# Patient Record
Sex: Female | Born: 1950
Health system: Southern US, Community
[De-identification: ages and names within clinical notes are randomized; demographics above are authoritative.]

## PROBLEM LIST (undated history)

## (undated) DIAGNOSIS — R112 Nausea with vomiting, unspecified: Secondary | ICD-10-CM

## (undated) DIAGNOSIS — Z9889 Other specified postprocedural states: Secondary | ICD-10-CM

## (undated) DIAGNOSIS — G5603 Carpal tunnel syndrome, bilateral upper limbs: Secondary | ICD-10-CM

## (undated) DIAGNOSIS — C50912 Malignant neoplasm of unspecified site of left female breast: Secondary | ICD-10-CM

## (undated) DIAGNOSIS — M1990 Unspecified osteoarthritis, unspecified site: Secondary | ICD-10-CM

## (undated) HISTORY — DX: Carpal tunnel syndrome, bilateral upper limbs: G56.03

## (undated) HISTORY — PX: COLONOSCOPY: SHX174

## (undated) HISTORY — DX: Unspecified osteoarthritis, unspecified site: M19.90

## (undated) HISTORY — PX: FRACTURE SURGERY: SHX138

## (undated) HISTORY — PX: CARPAL TUNNEL RELEASE: SHX101

---

## 1998-10-23 ENCOUNTER — Ambulatory Visit (HOSPITAL_COMMUNITY): Admission: RE | Admit: 1998-10-23 | Discharge: 1998-10-23 | Payer: Self-pay | Admitting: Gastroenterology

## 1999-03-11 ENCOUNTER — Other Ambulatory Visit: Admission: RE | Admit: 1999-03-11 | Discharge: 1999-03-11 | Payer: Self-pay | Admitting: *Deleted

## 1999-07-10 ENCOUNTER — Encounter: Admission: RE | Admit: 1999-07-10 | Discharge: 1999-07-10 | Payer: Self-pay | Admitting: *Deleted

## 1999-07-10 ENCOUNTER — Encounter: Payer: Self-pay | Admitting: *Deleted

## 1999-07-17 ENCOUNTER — Encounter: Admission: RE | Admit: 1999-07-17 | Discharge: 1999-07-17 | Payer: Self-pay | Admitting: *Deleted

## 1999-07-17 ENCOUNTER — Encounter: Payer: Self-pay | Admitting: *Deleted

## 1999-11-07 ENCOUNTER — Emergency Department (HOSPITAL_COMMUNITY): Admission: EM | Admit: 1999-11-07 | Discharge: 1999-11-07 | Payer: Self-pay

## 2000-03-22 ENCOUNTER — Other Ambulatory Visit: Admission: RE | Admit: 2000-03-22 | Discharge: 2000-03-22 | Payer: Self-pay | Admitting: *Deleted

## 2000-07-18 ENCOUNTER — Encounter: Payer: Self-pay | Admitting: Internal Medicine

## 2000-07-18 ENCOUNTER — Encounter: Admission: RE | Admit: 2000-07-18 | Discharge: 2000-07-18 | Payer: Self-pay | Admitting: Internal Medicine

## 2001-05-18 ENCOUNTER — Other Ambulatory Visit: Admission: RE | Admit: 2001-05-18 | Discharge: 2001-05-18 | Payer: Self-pay | Admitting: *Deleted

## 2001-07-20 ENCOUNTER — Encounter: Admission: RE | Admit: 2001-07-20 | Discharge: 2001-07-20 | Payer: Self-pay | Admitting: Internal Medicine

## 2001-07-20 ENCOUNTER — Encounter: Payer: Self-pay | Admitting: Internal Medicine

## 2002-07-13 ENCOUNTER — Encounter: Admission: RE | Admit: 2002-07-13 | Discharge: 2002-07-13 | Payer: Self-pay | Admitting: Internal Medicine

## 2002-07-13 ENCOUNTER — Encounter: Payer: Self-pay | Admitting: Internal Medicine

## 2002-07-23 ENCOUNTER — Encounter: Payer: Self-pay | Admitting: Internal Medicine

## 2002-07-23 ENCOUNTER — Encounter: Admission: RE | Admit: 2002-07-23 | Discharge: 2002-07-23 | Payer: Self-pay | Admitting: Internal Medicine

## 2002-07-24 ENCOUNTER — Encounter: Payer: Self-pay | Admitting: Internal Medicine

## 2002-07-24 ENCOUNTER — Encounter: Admission: RE | Admit: 2002-07-24 | Discharge: 2002-07-24 | Payer: Self-pay | Admitting: Internal Medicine

## 2002-08-15 ENCOUNTER — Ambulatory Visit (HOSPITAL_COMMUNITY): Admission: RE | Admit: 2002-08-15 | Discharge: 2002-08-15 | Payer: Self-pay | Admitting: Internal Medicine

## 2002-08-15 ENCOUNTER — Encounter: Payer: Self-pay | Admitting: Internal Medicine

## 2003-07-31 ENCOUNTER — Encounter: Admission: RE | Admit: 2003-07-31 | Discharge: 2003-07-31 | Payer: Self-pay | Admitting: Internal Medicine

## 2003-08-14 ENCOUNTER — Ambulatory Visit (HOSPITAL_COMMUNITY): Admission: RE | Admit: 2003-08-14 | Discharge: 2003-08-14 | Payer: Self-pay | Admitting: Internal Medicine

## 2003-08-31 DIAGNOSIS — C50912 Malignant neoplasm of unspecified site of left female breast: Secondary | ICD-10-CM

## 2003-08-31 HISTORY — PX: BREAST BIOPSY: SHX20

## 2003-08-31 HISTORY — DX: Malignant neoplasm of unspecified site of left female breast: C50.912

## 2003-09-12 ENCOUNTER — Encounter: Admission: RE | Admit: 2003-09-12 | Discharge: 2003-09-12 | Payer: Self-pay | Admitting: Internal Medicine

## 2003-09-18 ENCOUNTER — Encounter (INDEPENDENT_AMBULATORY_CARE_PROVIDER_SITE_OTHER): Payer: Self-pay | Admitting: *Deleted

## 2003-09-18 ENCOUNTER — Encounter: Admission: RE | Admit: 2003-09-18 | Discharge: 2003-09-18 | Payer: Self-pay | Admitting: Internal Medicine

## 2003-10-01 ENCOUNTER — Encounter (HOSPITAL_COMMUNITY): Admission: RE | Admit: 2003-10-01 | Discharge: 2003-12-30 | Payer: Self-pay | Admitting: General Surgery

## 2003-10-02 ENCOUNTER — Encounter (INDEPENDENT_AMBULATORY_CARE_PROVIDER_SITE_OTHER): Payer: Self-pay | Admitting: Specialist

## 2003-10-02 ENCOUNTER — Encounter: Payer: Self-pay | Admitting: General Surgery

## 2003-10-02 ENCOUNTER — Encounter: Admission: RE | Admit: 2003-10-02 | Discharge: 2003-10-02 | Payer: Self-pay | Admitting: General Surgery

## 2003-10-14 ENCOUNTER — Inpatient Hospital Stay (HOSPITAL_COMMUNITY): Admission: RE | Admit: 2003-10-14 | Discharge: 2003-10-16 | Payer: Self-pay | Admitting: General Surgery

## 2003-10-14 ENCOUNTER — Encounter (INDEPENDENT_AMBULATORY_CARE_PROVIDER_SITE_OTHER): Payer: Self-pay | Admitting: Specialist

## 2003-10-14 HISTORY — PX: MASTECTOMY WITH AXILLARY LYMPH NODE DISSECTION: SHX5661

## 2003-10-14 HISTORY — PX: RECONSTRUCTION BREAST W/ TRAM FLAP: SUR1079

## 2003-10-14 HISTORY — PX: MASTECTOMY: SHX3

## 2003-10-28 ENCOUNTER — Ambulatory Visit (HOSPITAL_COMMUNITY): Admission: RE | Admit: 2003-10-28 | Discharge: 2003-10-28 | Payer: Self-pay | Admitting: Oncology

## 2003-10-30 ENCOUNTER — Ambulatory Visit (HOSPITAL_COMMUNITY): Admission: RE | Admit: 2003-10-30 | Discharge: 2003-10-30 | Payer: Self-pay | Admitting: Oncology

## 2003-11-12 ENCOUNTER — Ambulatory Visit (HOSPITAL_BASED_OUTPATIENT_CLINIC_OR_DEPARTMENT_OTHER): Admission: RE | Admit: 2003-11-12 | Discharge: 2003-11-12 | Payer: Self-pay | Admitting: General Surgery

## 2003-11-12 ENCOUNTER — Ambulatory Visit: Admission: RE | Admit: 2003-11-12 | Discharge: 2003-12-17 | Payer: Self-pay | Admitting: Radiation Oncology

## 2003-11-20 ENCOUNTER — Ambulatory Visit (HOSPITAL_COMMUNITY): Admission: RE | Admit: 2003-11-20 | Discharge: 2003-11-20 | Payer: Self-pay | Admitting: Oncology

## 2004-02-25 ENCOUNTER — Ambulatory Visit (HOSPITAL_COMMUNITY): Admission: RE | Admit: 2004-02-25 | Discharge: 2004-02-25 | Payer: Self-pay | Admitting: Oncology

## 2004-03-31 ENCOUNTER — Ambulatory Visit (HOSPITAL_BASED_OUTPATIENT_CLINIC_OR_DEPARTMENT_OTHER): Admission: RE | Admit: 2004-03-31 | Discharge: 2004-03-31 | Payer: Self-pay | Admitting: General Surgery

## 2004-06-10 ENCOUNTER — Ambulatory Visit (HOSPITAL_BASED_OUTPATIENT_CLINIC_OR_DEPARTMENT_OTHER): Admission: RE | Admit: 2004-06-10 | Discharge: 2004-06-10 | Payer: Self-pay | Admitting: Plastic Surgery

## 2004-07-04 ENCOUNTER — Ambulatory Visit: Payer: Self-pay | Admitting: Oncology

## 2004-07-07 IMAGING — CR DG CHEST 2V
2 series · 2 of 2 positions shown · non-contrast
Comparison: none

CLINICAL DATA: 52-year-old with left breast cancer.  Preadmission.
 TWO-VIEW CHEST
 Comparison 09/09/95.
 The heart size and mediastinal contours are unremarkable.  The lungs are clear.  The visualized skeleton is unremarkable.
 IMPRESSION
 No active disease.

[view not recorded (1 of 2)]
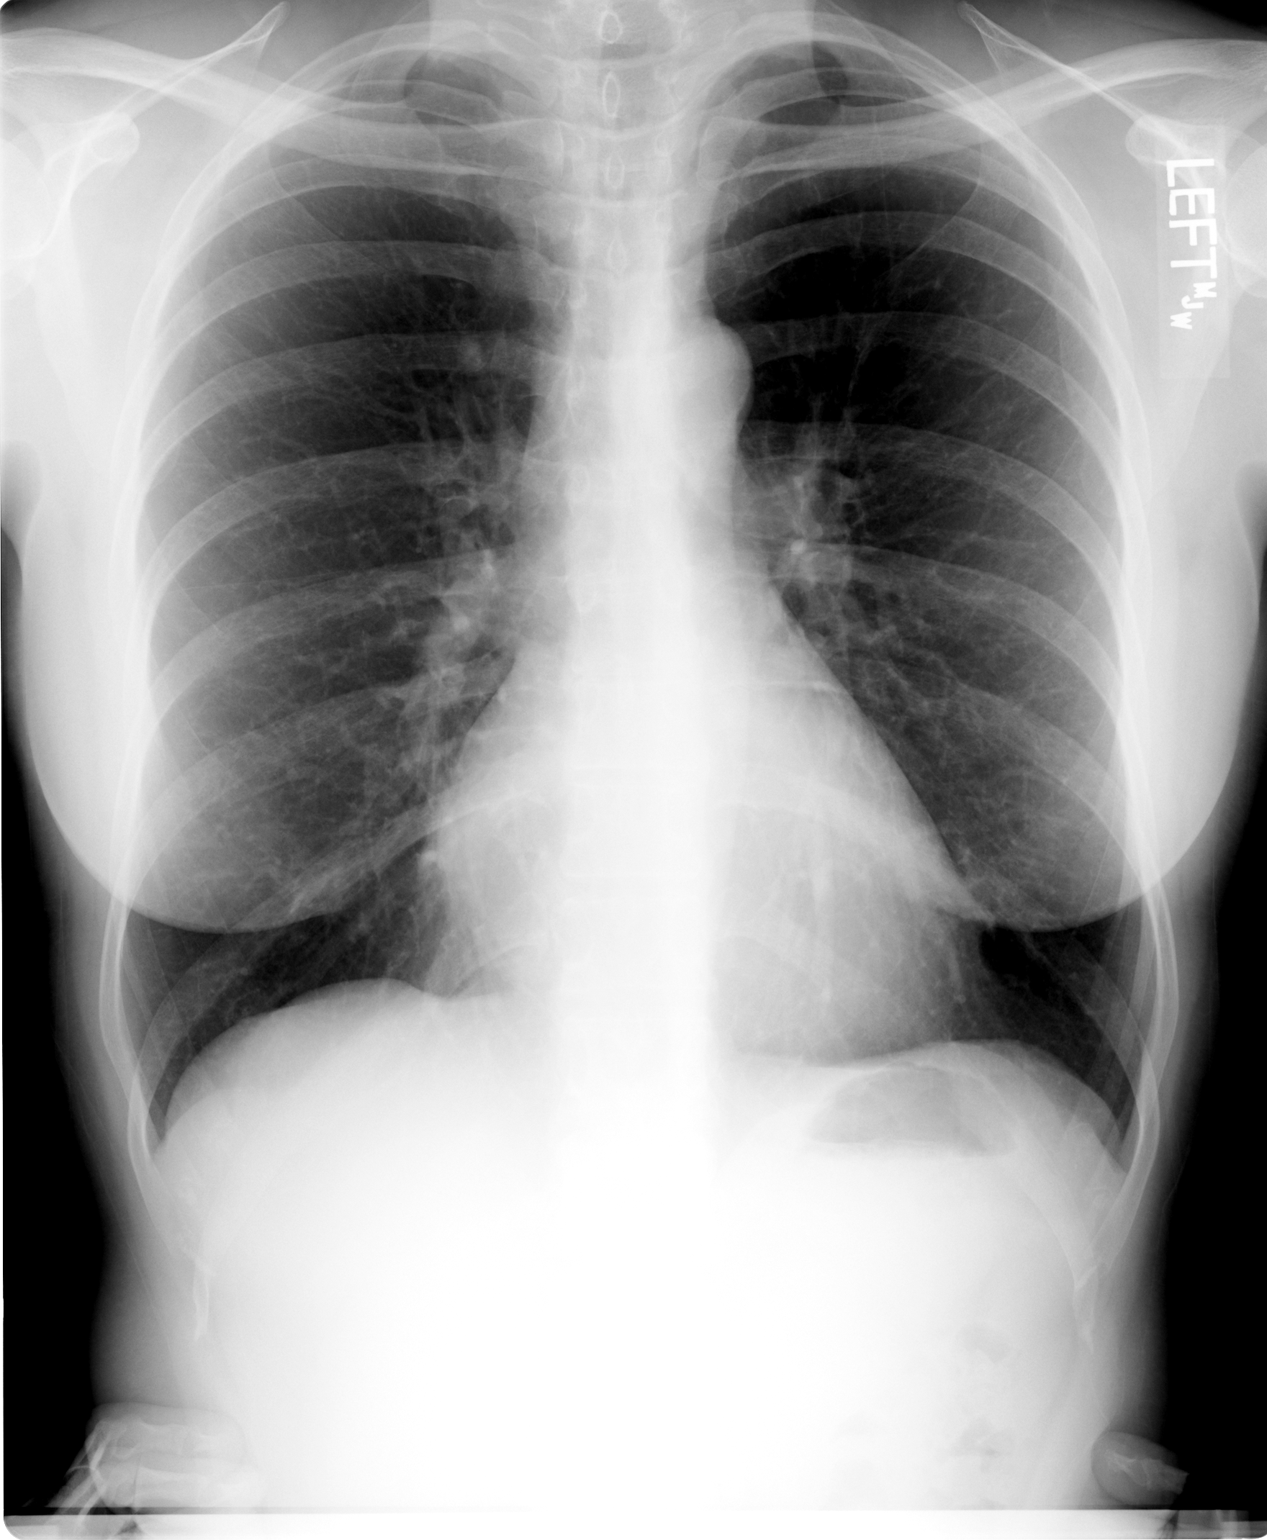

[view not recorded (2 of 2)]
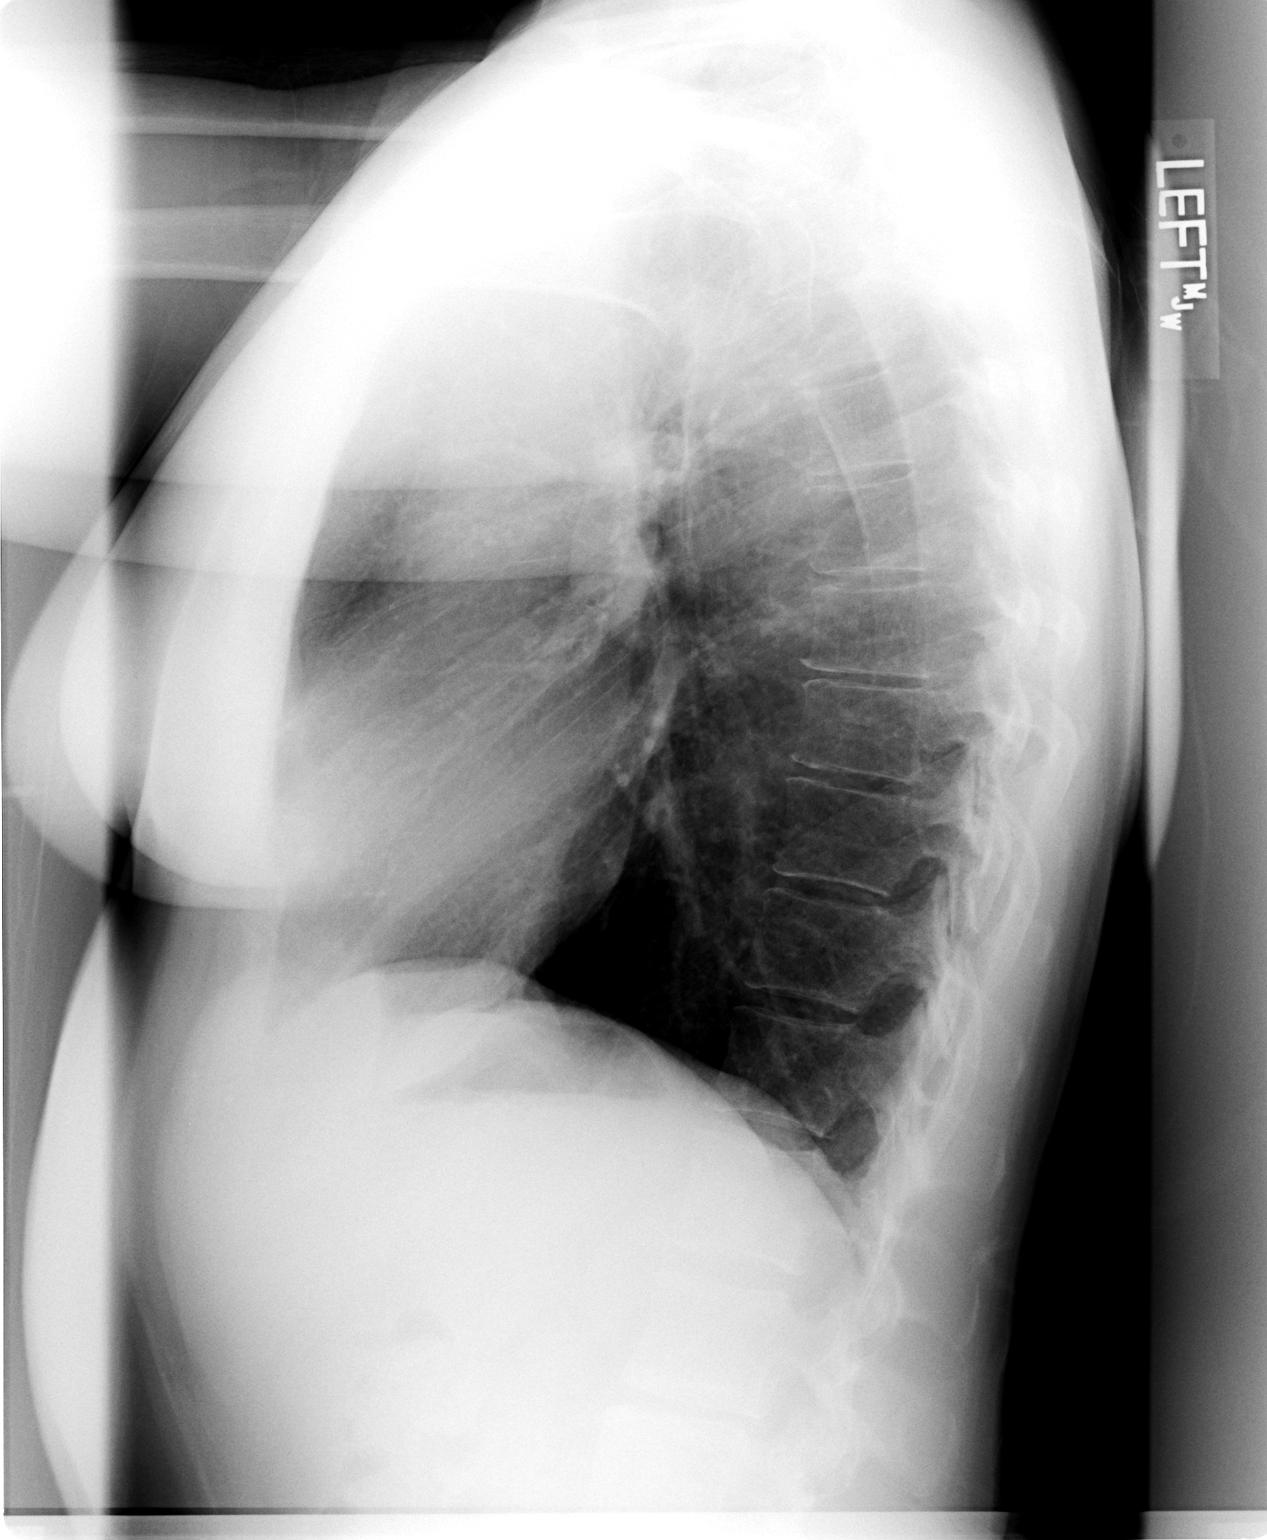

[2 of 2 positions shown; findings below may reference images not displayed]

## 2004-07-25 IMAGING — NM NM CARDIA MUGA REST
6 series · 36 of 36 positions shown · non-contrast
Comparison: none.

CLINICAL DATA: 52 year-old with new diagnosis of breast cancer; pre-chemotherapy baseline evaluation.
 NUCLEAR MEDICINE REST MUGA 10/28/03
TECHNIQUE: 23.3 mCi Ec00m labeled red blood cells were used for resting radionuclide ventriculography.

[Series 1: mu muga · 4.34mm/px · 6 of 16 frames shown (1 of 6)]
[frame 2/16]
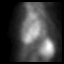
[frame 4/16]
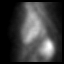
[frame 7/16]
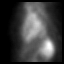
[frame 10/16]
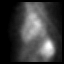
[frame 12/16]
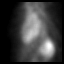
[frame 15/16]
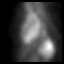

[Series 1: mu muga · 4.34mm/px · 6 of 16 frames shown (2 of 6)]
[frame 2/16]
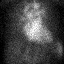
[frame 4/16]
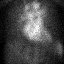
[frame 7/16]
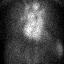
[frame 10/16]
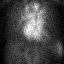
[frame 12/16]
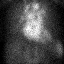
[frame 15/16]
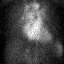

[Series 1: mu muga · 4.34mm/px · 6 of 16 frames shown (3 of 6)]
[frame 2/16]
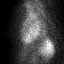
[frame 4/16]
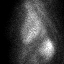
[frame 7/16]
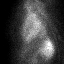
[frame 10/16]
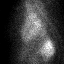
[frame 12/16]
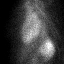
[frame 15/16]
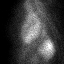

[Series 1: mu muga · 4.34mm/px · 6 of 16 frames shown (4 of 6)]
[frame 2/16]
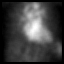
[frame 4/16]
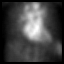
[frame 7/16]
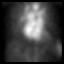
[frame 10/16]
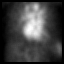
[frame 12/16]
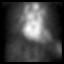
[frame 15/16]
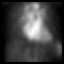

[Series 1: mu muga · 4.34mm/px · 6 of 16 frames shown (5 of 6)]
[frame 2/16]
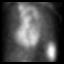
[frame 4/16]
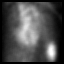
[frame 7/16]
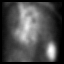
[frame 10/16]
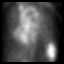
[frame 12/16]
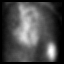
[frame 15/16]
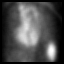

[Series 1: mu muga · 4.34mm/px · 6 of 16 frames shown (6 of 6)]
[frame 2/16]
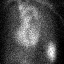
[frame 4/16]
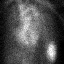
[frame 7/16]
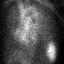
[frame 10/16]
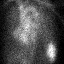
[frame 12/16]
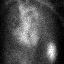
[frame 15/16]
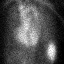

[36 of 36 positions shown; findings below may reference images not displayed]

FINDINGS: The estimated left ventricular ejection fraction was measured at 69% by one technologist and 67% by a second technologist.  Review of the gated images in cine mode on the computer demonstrates normal wall motion throughout.
 IMPRESSION
 Estimated resting left ventricular ejection fraction 68% with normal wall motion.

## 2004-09-15 ENCOUNTER — Encounter: Admission: RE | Admit: 2004-09-15 | Discharge: 2004-09-15 | Payer: Self-pay | Admitting: Oncology

## 2004-09-30 ENCOUNTER — Ambulatory Visit: Payer: Self-pay | Admitting: Oncology

## 2004-10-02 ENCOUNTER — Encounter: Admission: RE | Admit: 2004-10-02 | Discharge: 2004-10-02 | Payer: Self-pay | Admitting: Oncology

## 2004-11-17 ENCOUNTER — Ambulatory Visit (HOSPITAL_COMMUNITY): Admission: RE | Admit: 2004-11-17 | Discharge: 2004-11-17 | Payer: Self-pay | Admitting: Oncology

## 2005-01-13 ENCOUNTER — Ambulatory Visit: Payer: Self-pay | Admitting: Oncology

## 2005-03-30 ENCOUNTER — Ambulatory Visit: Payer: Self-pay | Admitting: Oncology

## 2005-07-20 ENCOUNTER — Ambulatory Visit: Payer: Self-pay | Admitting: Oncology

## 2005-08-04 ENCOUNTER — Encounter: Admission: RE | Admit: 2005-08-04 | Discharge: 2005-08-04 | Payer: Self-pay | Admitting: Oncology

## 2005-10-11 ENCOUNTER — Encounter: Admission: RE | Admit: 2005-10-11 | Discharge: 2005-10-11 | Payer: Self-pay | Admitting: Oncology

## 2005-11-18 ENCOUNTER — Ambulatory Visit: Payer: Self-pay | Admitting: Oncology

## 2006-03-28 ENCOUNTER — Ambulatory Visit: Payer: Self-pay | Admitting: Oncology

## 2006-03-28 LAB — CBC WITH DIFFERENTIAL/PLATELET
Basophils Absolute: 0 10*3/uL (ref 0.0–0.1)
EOS%: 0.6 % (ref 0.0–7.0)
HGB: 13.3 g/dL (ref 11.6–15.9)
MCH: 31.8 pg (ref 26.0–34.0)
MCV: 92.7 fL (ref 81.0–101.0)
MONO%: 6.3 % (ref 0.0–13.0)
RBC: 4.18 10*6/uL (ref 3.70–5.32)
RDW: 12.9 % (ref 11.3–14.5)

## 2006-03-28 LAB — COMPREHENSIVE METABOLIC PANEL
AST: 21 U/L (ref 0–37)
Albumin: 4.6 g/dL (ref 3.5–5.2)
Alkaline Phosphatase: 82 U/L (ref 39–117)
BUN: 20 mg/dL (ref 6–23)
Creatinine, Ser: 0.82 mg/dL (ref 0.40–1.20)
Potassium: 4.1 mEq/L (ref 3.5–5.3)

## 2006-10-13 ENCOUNTER — Encounter: Admission: RE | Admit: 2006-10-13 | Discharge: 2006-10-13 | Payer: Self-pay | Admitting: Oncology

## 2006-10-13 ENCOUNTER — Ambulatory Visit (HOSPITAL_COMMUNITY): Admission: RE | Admit: 2006-10-13 | Discharge: 2006-10-13 | Payer: Self-pay | Admitting: Oncology

## 2006-10-19 ENCOUNTER — Ambulatory Visit: Payer: Self-pay | Admitting: Oncology

## 2006-10-21 LAB — CBC WITH DIFFERENTIAL/PLATELET
BASO%: 0.4 % (ref 0.0–2.0)
Basophils Absolute: 0 10*3/uL (ref 0.0–0.1)
EOS%: 1.1 % (ref 0.0–7.0)
HCT: 39 % (ref 34.8–46.6)
HGB: 13.8 g/dL (ref 11.6–15.9)
LYMPH%: 26.5 % (ref 14.0–48.0)
MCH: 32 pg (ref 26.0–34.0)
MCHC: 35.2 g/dL (ref 32.0–36.0)
MCV: 90.9 fL (ref 81.0–101.0)
MONO%: 5.9 % (ref 0.0–13.0)
NEUT%: 66.1 % (ref 39.6–76.8)
Platelets: 257 10*3/uL (ref 145–400)
lymph#: 1.7 10*3/uL (ref 0.9–3.3)

## 2006-10-21 LAB — COMPREHENSIVE METABOLIC PANEL
ALT: 27 U/L (ref 0–35)
AST: 20 U/L (ref 0–37)
BUN: 13 mg/dL (ref 6–23)
Calcium: 9.3 mg/dL (ref 8.4–10.5)
Chloride: 102 mEq/L (ref 96–112)
Creatinine, Ser: 0.75 mg/dL (ref 0.40–1.20)
Total Bilirubin: 0.7 mg/dL (ref 0.3–1.2)

## 2006-10-21 LAB — CANCER ANTIGEN 27.29: CA 27.29: 14 U/mL (ref 0–39)

## 2007-04-06 ENCOUNTER — Ambulatory Visit: Payer: Self-pay | Admitting: Oncology

## 2007-04-10 LAB — CBC WITH DIFFERENTIAL/PLATELET
Basophils Absolute: 0 10*3/uL (ref 0.0–0.1)
Eosinophils Absolute: 0.1 10*3/uL (ref 0.0–0.5)
HGB: 12.6 g/dL (ref 11.6–15.9)
MONO#: 0.5 10*3/uL (ref 0.1–0.9)
NEUT#: 5.5 10*3/uL (ref 1.5–6.5)
Platelets: 230 10*3/uL (ref 145–400)
RBC: 3.89 10*6/uL (ref 3.70–5.32)
RDW: 12.6 % (ref 11.3–14.5)
WBC: 7.9 10*3/uL (ref 3.9–10.0)

## 2007-04-13 LAB — COMPREHENSIVE METABOLIC PANEL
Albumin: 4.2 g/dL (ref 3.5–5.2)
BUN: 12 mg/dL (ref 6–23)
CO2: 25 mEq/L (ref 19–32)
Calcium: 8.9 mg/dL (ref 8.4–10.5)
Glucose, Bld: 101 mg/dL — ABNORMAL HIGH (ref 70–99)
Potassium: 4.3 mEq/L (ref 3.5–5.3)
Sodium: 140 mEq/L (ref 135–145)
Total Protein: 6.5 g/dL (ref 6.0–8.3)

## 2007-04-13 LAB — VITAMIN D PNL(25-HYDRXY+1,25-DIHY)-BLD: Vit D, 25-Hydroxy: 36 ng/mL (ref 20–57)

## 2007-04-13 LAB — LACTATE DEHYDROGENASE: LDH: 187 U/L (ref 94–250)

## 2007-08-07 ENCOUNTER — Encounter: Admission: RE | Admit: 2007-08-07 | Discharge: 2007-08-07 | Payer: Self-pay | Admitting: Oncology

## 2007-10-11 ENCOUNTER — Ambulatory Visit: Payer: Self-pay | Admitting: Oncology

## 2007-10-13 LAB — CBC WITH DIFFERENTIAL/PLATELET
Basophils Absolute: 0 10*3/uL (ref 0.0–0.1)
EOS%: 0.6 % (ref 0.0–7.0)
Eosinophils Absolute: 0 10*3/uL (ref 0.0–0.5)
HGB: 13.4 g/dL (ref 11.6–15.9)
MCH: 29.9 pg (ref 26.0–34.0)
NEUT#: 5.6 10*3/uL (ref 1.5–6.5)
RBC: 4.5 10*6/uL (ref 3.70–5.32)
RDW: 12.8 % (ref 11.3–14.5)
lymph#: 1.6 10*3/uL (ref 0.9–3.3)

## 2007-10-13 LAB — COMPREHENSIVE METABOLIC PANEL
AST: 23 U/L (ref 0–37)
Albumin: 4.8 g/dL (ref 3.5–5.2)
BUN: 14 mg/dL (ref 6–23)
Calcium: 9.5 mg/dL (ref 8.4–10.5)
Chloride: 104 mEq/L (ref 96–112)
Potassium: 3.6 mEq/L (ref 3.5–5.3)
Sodium: 142 mEq/L (ref 135–145)
Total Protein: 7.5 g/dL (ref 6.0–8.3)

## 2007-10-16 ENCOUNTER — Encounter: Admission: RE | Admit: 2007-10-16 | Discharge: 2007-10-16 | Payer: Self-pay | Admitting: Oncology

## 2008-04-09 ENCOUNTER — Ambulatory Visit: Payer: Self-pay | Admitting: Oncology

## 2008-04-11 LAB — CBC WITH DIFFERENTIAL/PLATELET
BASO%: 0.3 % (ref 0.0–2.0)
Eosinophils Absolute: 0.1 10*3/uL (ref 0.0–0.5)
HCT: 40.8 % (ref 34.8–46.6)
HGB: 14.1 g/dL (ref 11.6–15.9)
MCHC: 34.4 g/dL (ref 32.0–36.0)
MCV: 92.2 fL (ref 81.0–101.0)
NEUT#: 5.2 10*3/uL (ref 1.5–6.5)
Platelets: 254 10*3/uL (ref 145–400)
RBC: 4.43 10*6/uL (ref 3.70–5.32)
RDW: 12.8 % (ref 11.3–14.5)
lymph#: 2 10*3/uL (ref 0.9–3.3)

## 2008-04-12 LAB — COMPREHENSIVE METABOLIC PANEL
Albumin: 4.7 g/dL (ref 3.5–5.2)
BUN: 13 mg/dL (ref 6–23)
Calcium: 9.6 mg/dL (ref 8.4–10.5)
Chloride: 102 mEq/L (ref 96–112)
Glucose, Bld: 106 mg/dL — ABNORMAL HIGH (ref 70–99)
Potassium: 3.9 mEq/L (ref 3.5–5.3)
Total Protein: 7.8 g/dL (ref 6.0–8.3)

## 2008-10-14 ENCOUNTER — Ambulatory Visit: Payer: Self-pay | Admitting: Oncology

## 2008-10-16 ENCOUNTER — Encounter: Admission: RE | Admit: 2008-10-16 | Discharge: 2008-10-16 | Payer: Self-pay | Admitting: Oncology

## 2008-10-16 LAB — CBC WITH DIFFERENTIAL/PLATELET
BASO%: 0.2 % (ref 0.0–2.0)
Basophils Absolute: 0 10*3/uL (ref 0.0–0.1)
LYMPH%: 19.4 % (ref 14.0–48.0)
MCH: 31.8 pg (ref 26.0–34.0)
MONO#: 0.4 10*3/uL (ref 0.1–0.9)
NEUT#: 5.4 10*3/uL (ref 1.5–6.5)
NEUT%: 73.8 % (ref 39.6–76.8)
Platelets: 245 10*3/uL (ref 145–400)
RDW: 12.7 % (ref 11.3–14.5)
WBC: 7.3 10*3/uL (ref 3.9–10.0)
lymph#: 1.4 10*3/uL (ref 0.9–3.3)

## 2008-10-16 LAB — COMPREHENSIVE METABOLIC PANEL
ALT: 36 U/L — ABNORMAL HIGH (ref 0–35)
BUN: 18 mg/dL (ref 6–23)
Chloride: 99 mEq/L (ref 96–112)
Glucose, Bld: 97 mg/dL (ref 70–99)
Potassium: 3.7 mEq/L (ref 3.5–5.3)
Total Bilirubin: 0.6 mg/dL (ref 0.3–1.2)
Total Protein: 7.3 g/dL (ref 6.0–8.3)

## 2009-04-15 ENCOUNTER — Ambulatory Visit: Payer: Self-pay | Admitting: Oncology

## 2009-04-17 LAB — CBC WITH DIFFERENTIAL/PLATELET
EOS%: 0.8 % (ref 0.0–7.0)
HCT: 38.5 % (ref 34.8–46.6)
HGB: 13.2 g/dL (ref 11.6–15.9)
MCH: 31.7 pg (ref 25.1–34.0)
MCHC: 34.3 g/dL (ref 31.5–36.0)
MCV: 92.3 fL (ref 79.5–101.0)
Platelets: 244 10*3/uL (ref 145–400)
RBC: 4.17 10*6/uL (ref 3.70–5.45)
RDW: 13 % (ref 11.2–14.5)

## 2009-04-17 LAB — COMPREHENSIVE METABOLIC PANEL
Albumin: 4.3 g/dL (ref 3.5–5.2)
BUN: 14 mg/dL (ref 6–23)
Chloride: 105 mEq/L (ref 96–112)
Glucose, Bld: 139 mg/dL — ABNORMAL HIGH (ref 70–99)
Potassium: 3.5 mEq/L (ref 3.5–5.3)
Sodium: 139 mEq/L (ref 135–145)
Total Protein: 7.2 g/dL (ref 6.0–8.3)

## 2009-04-17 LAB — LACTATE DEHYDROGENASE: LDH: 166 U/L (ref 94–250)

## 2009-04-18 LAB — VITAMIN D 25 HYDROXY (VIT D DEFICIENCY, FRACTURES): Vit D, 25-Hydroxy: 48 ng/mL (ref 30–89)

## 2009-10-21 ENCOUNTER — Encounter: Admission: RE | Admit: 2009-10-21 | Discharge: 2009-10-21 | Payer: Self-pay | Admitting: Oncology

## 2010-04-21 ENCOUNTER — Ambulatory Visit: Payer: Self-pay | Admitting: Oncology

## 2010-04-23 LAB — COMPREHENSIVE METABOLIC PANEL
ALT: 22 U/L (ref 0–35)
AST: 20 U/L (ref 0–37)
Albumin: 4.6 g/dL (ref 3.5–5.2)
BUN: 20 mg/dL (ref 6–23)
CO2: 25 mEq/L (ref 19–32)
Calcium: 9.7 mg/dL (ref 8.4–10.5)
Potassium: 3.9 mEq/L (ref 3.5–5.3)
Total Protein: 6.7 g/dL (ref 6.0–8.3)

## 2010-04-23 LAB — CBC WITH DIFFERENTIAL/PLATELET
BASO%: 0.4 % (ref 0.0–2.0)
Basophils Absolute: 0 10*3/uL (ref 0.0–0.1)
EOS%: 0.7 % (ref 0.0–7.0)
HGB: 13.6 g/dL (ref 11.6–15.9)
LYMPH%: 25 % (ref 14.0–49.7)
MCH: 31.5 pg (ref 25.1–34.0)
MCV: 92.8 fL (ref 79.5–101.0)
NEUT%: 66.5 % (ref 38.4–76.8)
RBC: 4.32 10*6/uL (ref 3.70–5.45)
RDW: 13 % (ref 11.2–14.5)

## 2010-04-23 LAB — VITAMIN D 25 HYDROXY (VIT D DEFICIENCY, FRACTURES): Vit D, 25-Hydroxy: 65 ng/mL (ref 30–89)

## 2010-09-19 ENCOUNTER — Encounter: Payer: Self-pay | Admitting: General Surgery

## 2010-09-19 ENCOUNTER — Other Ambulatory Visit: Payer: Self-pay | Admitting: Oncology

## 2010-09-19 DIAGNOSIS — Z9012 Acquired absence of left breast and nipple: Secondary | ICD-10-CM

## 2010-10-27 ENCOUNTER — Ambulatory Visit
Admission: RE | Admit: 2010-10-27 | Discharge: 2010-10-27 | Disposition: A | Discharge status: Home / Self Care | Payer: 59 | Source: Ambulatory Visit | Attending: Oncology | Admitting: Oncology

## 2010-10-27 DIAGNOSIS — Z9012 Acquired absence of left breast and nipple: Secondary | ICD-10-CM

## 2011-01-15 NOTE — Op Note (Signed)
[name], [name]                           ACCOUNT NO.:  [id number]   MEDICAL RECORD NO.:  [id number]                   PATIENT TYPE:  AMB   LOCATION:  DSC                                  FACILITY:  [facility]   PHYSICIAN:  Rose Phi. Maple Hudson, M.D.                DATE OF BIRTH:  [birth year]   DATE OF PROCEDURE:  03/31/2004  DATE OF DISCHARGE:                                 OPERATIVE REPORT   REFERRING PHYSICIAN:  Rolan Bucco L. Kearney Hard, M.D.   PREOPERATIVE DIAGNOSIS:  Carcinoma of the breast.   POSTOPERATIVE DIAGNOSIS:  Carcinoma of the breast.   OPERATION:  Removal of Port-A-Cath.   SURGEON:  Rose Phi. Maple Hudson, M.D.   ANESTHESIA:  Local.   DESCRIPTION OF PROCEDURE:  The patient placed on the operating table and the  right upper chest prepped and draped in the usual fashion.  Under local  anesthesia, the old incision was opened up and the port exposed.  I grasped  the catheter and removed it.  There was no bleeding.  We divided the two  Prolene sutures, holding it in place and removed the port.  Again, there was  no bleeding.  Subcuticular closure of 4-0 Monocryl and Steri-Strips carried  out.  Dressing applied.  The patient then allowed to go home.                                               Rose Phi. Maple Hudson, M.D.    PRY/MEDQ  D:  03/31/2004  T:  03/31/2004  Job:  [id number]   cc:   Rolan Bucco L. Dover, M.D.  [address]  [place]  [place] [address]  Fax: [phone]

## 2011-01-15 NOTE — Op Note (Signed)
[name], [name]                           ACCOUNT NO.:  [id number]   MEDICAL RECORD NO.:  [id number]                   PATIENT TYPE:  AMB   LOCATION:  DSC                                  FACILITY:  [facility]   PHYSICIAN:  Rose Phi. Maple Hudson, M.D.                DATE OF BIRTH:  Mar 27, 1951   DATE OF PROCEDURE:  11/12/2003  DATE OF DISCHARGE:                                 OPERATIVE REPORT   PREOPERATIVE DIAGNOSIS:  Stage II carcinoma of the left breast.   POSTOPERATIVE DIAGNOSIS:  Stage II carcinoma of the left breast.   OPERATION PERFORMED:  Insertion of Port-A-Cath.   SURGEON:  Rose Phi. Maple Hudson, M.D.   ANESTHESIA:  MAC.   DESCRIPTION OF PROCEDURE:  The patient was placed on the operating table  with a roll between the shoulders and arms down by the side.  The right  upper chest and the neck were prepped and draped in the usual fashion.  Right subclavian puncture was carried out without difficulty and a guidewire  inserted and proper positioning of the guidewire confirmed by fluoroscopy.  We then made an incision on the anterior chest wall and developed a pocket  for the implantable port.  I tunneled between the subclavian puncture site  and the newly developed pocket and passed the catheter through this and then  connected it to the X-Port which was flushed and full of heparinized saline.  We anchored the port in the new pocket with two 2-0 Prolene sutures.  The  catheter tip was then cut to go right at about the fourth interspace.  We  then passed the dilator and peel-away sheath over the wire and removed the  wire followed by the dilator and then passed the catheter through the peel-  away sheath and then removed it.  Fluoroscopy then showed that the catheter  had actually gone in the subclavian vein and then into the left side.  I  then exposed the catheter insertion going under the clavicle and withdrew it  until it was in the proper position, then slid it forward and went down  in  the superior vena cava.  The incisions were then closed with 3-0 Vicryl,  subcuticular 4-0 Monocryl and Steri-Strips.  The system was flushed with  heparinized and the fully heparinized.  It was not left accessed.  Dressings  were applied.  The patient was then transferred to the recovery room in  satisfactory condition having tolerated the procedure well.                                               Rose Phi. Maple Hudson, M.D.    PRY/MEDQ  D:  11/12/2003  T:  11/13/2003  Job:  [id number]

## 2011-01-15 NOTE — Discharge Summary (Signed)
[name], [name]                           ACCOUNT NO.:  [id number]   MEDICAL RECORD NO.:  [id number]                   PATIENT TYPE:  INP   LOCATION:  5710                                 FACILITY:  [facility]   PHYSICIAN:  Alfredia Ferguson, M.D.               DATE OF BIRTH:  [birth year]   DATE OF ADMISSION:  10/14/2003  DATE OF DISCHARGE:  10/16/2003                                 DISCHARGE SUMMARY   ADMISSION DIAGNOSIS:  Left breast carcinoma.   DISCHARGE DIAGNOSIS:  Left breast carcinoma.   OPERATION PERFORMED:  1. Left total mastectomy with axillary lymph node dissection (two lymph     nodes positive).  2. Immediate breast reconstruction with left transverse rectus abdominis     myocutaneous flap.  3. Immediate nipple reconstruction with tripartite flap.  4. Placement of continuous flow pain pump in abdomen.   CHIEF COMPLAINT:  I have breast cancer.   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 She has been advised that  because of the multicentric nature of the disease she will need to undergo  mastectomy. She has agreed to proceed with that surgery and wishes to  undergo immediate breast reconstruction with a tram flap. The patient is  admitted to the hospital for mastectomy and breast reconstruction.   PAST MEDICAL HISTORY:  Significant for occasional GI problems with diarrhea.  The patient also complains of occasional skipped beat in her heart.   PAST SURGICAL HISTORY:  Negative.   MEDICATIONS:  Mobic 7.5 mg a day.   ALLERGIES:  None.   PHYSICAL EXAMINATION:  Please see admission H&P for complete physical exam.   ADMISSION LABORATORY DATA:  Admission laboratory values included a  hemoglobin of 13.9, hematocrit 40. Electrolytes were normal. Urinalysis was  negative. Chest x-ray was normal. Echocardiogram was negative for any  disease.   HOSPITAL COURSE:  On the day of admission,  the patient was taken to the OR  where she underwent left mastectomy. Sentinel node biopsy was suspicious,  and axillary node dissection was carried out. The final pathology returned  two lymph nodes with metastatic carcinoma. One of the lymph nodes was the  sentinel node and one was in the axillary dissection which had a total of  eight lymph nodes removed. The patient also underwent immediate breast  reconstruction with ipsilateral transverse rectus abdominis myocutaneous  flap. She had nipple reconstruction performed immediately and placement of a  continuous infusion (On-Q) pain pump. Postoperative course has been  completely uneventful for the patient. She was started on a diet on the  night of surgery and advanced to regular the following day. She was able to  ambulate on the first postoperative day. She kept her PAS stockings on  throughout her hospitalization while in bed. Dressings were removed on the  second postoperative day.  The TRAM appears to be soft and in good condition.  The nipple appears to be slightly dusky in color but has islands of pink and  I believe will be okay. Abdomen looks fine. Umbilicus is viable. Drainage is  significant enough that I am going to leave the Bolton drains in place at  discharge.   DISCHARGE MEDICATIONS:  Vicodin and Keflex.   FOLLOW UP:  Followup will be provided in five days in my office.   DISCHARGE INSTRUCTIONS:  Discharge instructions have been provided with the  patient from my office. The patient states she understands the discharge  instructions. The pain pump was removed just prior to discharge.                                                Alfredia Ferguson, M.D.    WBB/MEDQ  D:  10/16/2003  T:  10/16/2003  Job:  [id number]

## 2011-01-15 NOTE — Op Note (Signed)
NAMELORIANNE, [name]                 ACCOUNT NO.:  [id number]   MEDICAL RECORD NO.:  [id number]          PATIENT TYPE:  AMB   LOCATION:  DSC                          FACILITY:  [facility]   PHYSICIAN:  Alfredia Ferguson, M.D.  DATE OF BIRTH:  Aug 08, 1951   DATE OF PROCEDURE:  06/10/2004  DATE OF DISCHARGE:                                 OPERATIVE REPORT   PREOPERATIVE DIAGNOSIS:  1.  History of breast cancer.  2.  Acquired absence of left breast.  3.  Acquired asymmetry of bilateral breasts due to breast reconstruction      with transverse rectus abdominis myocutaneous flap on left.  4.  Acquired absence of left nipple.   POSTOPERATIVE DIAGNOSIS:  1.  History of breast cancer.  2.  Acquired absence of left breast.  3.  Acquired asymmetry of bilateral breasts due to breast reconstruction      with transverse rectus abdominis myocutaneous flap on left.  4.  Acquired absence of left nipple.   OPERATION PERFORMED:  1.  Revision, left reconstructed breast using suction assisted lipectomy of      lateral breast and lateral axillary region.  2.  Left nipple reconstruction with double apposing tab technique.   SURGEON:  Alfredia Ferguson, M.D.   ANESTHESIA:  General laryngeal mask.   INDICATIONS FOR PROCEDURE:  This is a 60 year old woman who status post  immediate TRAM flap reconstruction following mastectomy.  She has slightly  larger TRAM flap than the opposite native breast.  Most of the fullness is  in the lateral area.  The patient wishes to undergo revision of the breast  using suction assisted lipectomy as a technique to reduce the lateral aspect  of the breast.  The patient has also had a previous attempt at immediate  nipple reconstruction at the time of her TRAM.  The projection of the nipple  was completely lost.  I am going to attempt to redo the nipple  reconstruction with double apposing tab technique.  The patient understands  the risks of this surgery including inability to  restore symmetry, bleeding,  infection, hematoma, seroma, loss of sensitivity to the skin in the axillary  region.  Necrosis of the reconstructed nipple, loss of projection of the  nipple and overall dissatisfaction with reconstruction.  In spite of that  the patient wishes to proceed with the surgery.   DESCRIPTION OF PROCEDURE:  With the patient in a standing position and prior  to being taken to the operating room, outline for the excess tissue was  marked.  The patient was then taken to the operating room where she was  given general laryngeal mask anesthesia.  Chest was prepped with Betadine  and draped with sterile drapes.  Local anesthesia using a tumescent solution  (1 L of Ringers lactate plus 1 ampule epinephrine 1:1000 plus 30 mL 1%  Xylocaine plain) was infiltrated in the area of liposuction.  Approximately  300 mL of this solution was infiltrated in the lateral aspect of the breast  and axillary region.  While the tumescent solution was setting up and  delivering the vasoconstriction, attention was directed to the nipple  reconstruction.  A lazy-S skin mark was placed using the transverse line of  the mastectomy incision as the long limb of the S.  This S was horizontally  oriented.  At the end of each limb of the short portion of the S, a small  skin mark was placed outlining a pennant of skin.  This S was now incised  down to the subcutaneous tissue.  The square mark at the end of each limb of  the S was now incised and a split thickness skin was elevated up until  reaching the point of the end of the S.  At this point the deeper incision  was extended into the subcutaneous tissues such that each limb of the S had  a small pennant of skin hanging from the end.  The two tabs were now  elevated to the midportion of the marked circle.  The two tabs of tissue  were then fixed to one another in a criss-cross fashion as two hands in  prayer.  They were fixed to each other with  multiple 4-0 chromic sutures.  The tabs of skin were wrapped around the base of this cylinder and fixed in  position with similar suture creating a completely closed cylinder.  The  donor site was now closed by approximating the dermis with multiple  interrupted PDS.  The skin edges were united with a running 4-0 chromic  suture.  The vascularity of the nipple appeared to be good.  One flap was  very pink with good capillary refill.  The lateral flap was somewhat  vasoconstricted but I felt this might be secondary to the tumescent solution  which was in the vicinity.  Liposuction was now carried out in the lateral  reconstructed breast and lateral axillary region using a 3 mm cannula and  the power assisted liposuction technique.  Approximately 200 g of fat was  removed.  There was almost no bleeding.  I felt that I had thinned it to  match the opposite side.  Liposuction was terminated.  Each of the two  access incisions which I had placed were closed with a single 5-0 nylon  suture.  A bulky protective dressing was placed around the nipple.  Foam  pads were placed in the axilla in the area of liposuction.  A  circumferential wrap of 6 inch Ace bandage was placed.  The patient  tolerated the procedure well with almost no bleeding.  The chest was  cleansed, dried and the patient was awakened and extubated and transported  to the recovery room in satisfactory condition.       WBB/MEDQ  D:  06/10/2004  T:  06/10/2004  Job:  [id number]

## 2011-01-15 NOTE — Op Note (Signed)
[name], [name]                           ACCOUNT NO.:  [id number]   MEDICAL RECORD NO.:  [id number]                   PATIENT TYPE:  INP   LOCATION:  2550                                 FACILITY:  [facility]   PHYSICIAN:  Alfredia Ferguson, M.D.               DATE OF BIRTH:  May 18, 1951   DATE OF PROCEDURE:  10/14/2003  DATE OF DISCHARGE:                                 OPERATIVE REPORT   PREOPERATIVE DIAGNOSIS:  1. Breast cancer.  2. Acquired absence of left breast secondary to #1.   POSTOPERATIVE DIAGNOSIS:  1. Breast cancer.  2. Acquired absence of left breast secondary to #1.   PROCEDURE:  1. Left transverse rectus abdominis myocutaneous flap.  2. Creation of nipple using tripartite flap.  3. Placement of pain pump for postoperative pain relief.   SURGEON:  Alfredia Ferguson, M.D.   ASSISTANT:  Jacky Kindle, RNFA   INDICATIONS FOR PROCEDURE:  This is a 60 year old woman with left breast  carcinoma. She has opted to undergo a mastectomy and wishes to undergo a  TRAM flap reconstruction. The plan is to perform an ultra skin-sparing  mastectomy and create a nipple at the time of the breast reconstruction.  The patient understands the risks of this surgery including asymmetry of the  breasts, vascular compromise of the breast creating either complete loss of  the flap or fat necrosis.  The possibility of bleeding, infection, seroma,  hematoma, unsitely scar, abdominal hernia problems, vascular compromise to  the umbilicus, unsitely scarring in the abdomen, and overall dissatisfaction  of the results were discussed with the patient at length. The patient wishes  to proceed with the surgery.   DESCRIPTION OF PROCEDURE:  On the morning of surgery, skin marks were placed  outlining the dimensions of the TRAM flap.  The plan is to do an ipsilateral  TRAM.  The patient was taken to the operating room where abdomen and chest  were prepped and draped in the usual sterile fashion.  My  portion of the  surgery commenced first while waiting for Dr. Maple Hudson to come to the operating  room.  A circular incision was made around the umbilicus and the umbilical  stalk was dissected away from the surrounding abdominal tissue.  The upper  skin mark was incised and the abdominal flap was elevated to the costal  margins on either side and the xiphoid in the midline.  The operating table  back was elevated to ensure that closure could be accomplished at the  anticipated lower skin paddle skin mark and the closure could be with no  significant tension.  Once I was certain of this, the lower skin incision  was made and deepened through the skin and subcutaneous tissue.  The right  side of the skin paddle was elevated off the abdominal wall and the flap was  lifted off 1 cm to the left of  linea alba. The left corner of the skin  paddle was elevated until visualizing the lateral row of the rectus  perforators which was approximately 3 cm medial to the lateral rectus  border.  About this time, Dr. Maple Hudson came to the operating room.  Moist laps  were placed in the wound and care was turned back over to Dr. Maple Hudson, who  completed the mastectomy and an axillary node dissection.  Upon completion  of this, I returned to the operating room to continue my portion of the  procedure.  Two parallel incisions were made in the anterior rectus fascia  beginning at the costal margin on the left side.  These two incisions were  approximately 2 cm apart.  The incisions were through the anterior rectus  fascia until visualizing the rectus muscle.  The medial incision continued  inferiorly skirting along the medial connection of the skin paddle to the  medial anterior rectus fascia.  The lateral fascial incision continued  inferiorly skirting along the lateral connection of the skin paddle to the  anterior rectus fascia.  These two incisions met at the inferior connection  of the skin paddle.  The anterior  rectus fascia was dissected off of the  rectus muscle with care at each tendinous inscription to preserve the  delicate vasculature at this point.  The deep portion of the rectus muscle  was dissected out of its anatomic bed using a combination of electrocautery  and bipolar dissection.  The deep inferior epigastric vessels were  visualized and isolated.  The artery was hemoclipped and the vessel was  divided between multiple hemoclips.  The two veins were hemoclipped in a  similar fashion and divided between multiple hemoclips.  The inferior  portion of the rectus muscle just below the arcuate line was divided using  electrocautery.  The muscle along with the attached skin paddle was now  completely freed except for its connection at the costal margins.  The  superior epigastric vessels were visualized and preserved.  I removed zone 4  of the skin paddle.  The mastectomy defect was inspected.  Hemostasis was  accomplished using electrocautery.  It was copiously irrigated with warm  saline irrigation.  A 10 mm Blake drain was placed in the lateral axillary  gutter and brought out through a separate stab incision.  The inferior  medial corner of the mastectomy dissection, I dissected inferiorly until  connecting with my abdominal dissection.  The rectus flap was now tunneled  and brought up through the mastectomy defect and temporarily stapled in  position.  The abdominal wound was copiously irrigated with warm saline  irrigation and hemostasis was assured using electrocautery.  The anterior  rectus fascia was closed using multiple interrupted buried figure-of-eight 0  Prolene sutures.  Onlay mesh using Marlex mesh was placed over my suture  closure and fixed in position using a running 2-0 Prolene suture along the  periphery of the mesh.  An opening in the mesh was made and the umbilicus  was brought out through this opening.  The pain pump catheters were placed in the desired position and  brought out through a needle puncture holes and  flushed with 0.25% Marcaine.  They were fixed in position using Steri-Strips  until later a permanent connection to the pump at the conclusion of the  procedure.  The abdominal wound was now closed.  This was begun by first  elevating the back of the patient to 30 degrees and flexing the  knees.  The  midline of the incision was approximated using interrupted 2-0 Vicryl  suture.  The incision was temporarily stapled with no tension.  A new  opening for the umbilicus was marked and incised and the umbilicus was  brought through this new opening and fixed in its new position using  multiple interrupted 3-0 Vicryl sutures.  Prior to complete closure, a 10 mm  Blake drain was placed in the lower abdominal wound in a transversely  oriented direction and brought out through a separate stab incision.  The  abdominal wound closure was completed by approximating the dermis using  multiple interrupted 2-0 Vicryl sutures combined with interrupted 3-0  Monocryl sutures.  A running 3-0 Monocryl subcuticular was placed in the  skin edges.  This completed the abdominal closure. The abdominal skin  appeared to be very healthy with excellent capillary refill.  Attention was  directed back to the mastectomy site.  Because the only skin defect taken by  Dr. Maple Hudson was the nipple areolar complex, my plan was to reduce the skin  paddle to the desired size, fit it into the mastectomy defect, and the mark  the location of the nipple areolar complex on the skin paddle. This was  performed without difficulty.  The skin paddle was reduced by removing some  of zone 3 and some of zone 4.  There was excellent bleeding at the cut edge.  The skin paddle was placed in the desired position and the opening at the  skin defect on the natural breast flaps was transposed using a skin marker  onto the skin paddle of the TRAM.  The skin paddle was now brought back out  from the  mastectomy defect.  Within the confines of this circle, a  tripartite flap was designed.  The three points of the flap were pointing to  the 12 o'clock, 3 o'clock, and 9 o'clock positions.  A 2 cm wide, inferiorly  based gap was not marked so that we would not incise it and this would be  the random skin paddle to keep the nipple alive.  This tripartite flap was  incised and was elevated based on the 2 cm inferiorly based skin pedicle.  The flap had approximately 6 to 7 mm of fat on it. The tips of the three  flaps were removed creating blunt ends.  The two flaps that were elevated at  the 9 o'clock and 3 o'clock position were rotated toward another and the  blunt ends were fixed to each other using 4-0 chromic suture.  The flap  which was at the 12 o'clock position was closed down on top of the created  cylinder using a similar suture.  This created the nipple.  The donor site  was closed using interrupted 3-0 Vicryl sutures for the dermis.  The base of the flap was fixed to the cut edges of the donor site flaps.  Vascularity of  the flap appeared to be excellent.  A 42 mm diameter circle was now drawn  around the nipple and this circle was incised creating a 42 mm diameter neo-  areola.  All of the skin and the nipple was left intact and the remainder of  the skin paddle was deepithelialized.  There was excellent bleeding at the  cut edge of the deepithelialized dermis.  The entire flap was returned to  the mastectomy defect and the superior edge of the flap was sewn to the  superior cut dissection of the  mastectomy site.  3-0 Vicryl sutures were  used to suspend the flap from the superior limits of the dissection.  The  mastectomy incision was now closed by approximating the two skin incisions  extending away from the 3 o'clock and 9 o'clock positions from the circular  defect where the nipple areolar complex used to be.  These cut edges were  approximated using interrupted 3-0 Monocryl  suture.  This left a perfectly  circular defect for the neo-nipple areolar complex.  This was fixed in  position using a running 3-0 Monocryl subcuticular.  This completed the  reconstruction.  Symmetry appeared to be good. There was excellent bleeding  of the TRAM prior to completely closing the skin incisions.  The nipple  areolar complex appeared to be viable.  Light dressings were applied over  the mastectomy incisions.  Light dressings were applied over the abdominal  incisions.  The pain pump catheters were connected to the pain pump.  The  site where the catheters exited the skin were covered with Tegaderm. The  patient's estimated blood loss was approximately 200 mL.  The patient was  awakened, extubated, and transported to the recovery room in satisfactory  condition.                                               Alfredia Ferguson, M.D.    WBB/MEDQ  D:  10/14/2003  T:  10/14/2003  Job:  [id number]

## 2011-01-15 NOTE — Op Note (Signed)
NAMEJAYANA, [name]                           ACCOUNT NO.:  [id number]   MEDICAL RECORD NO.:  [id number]                   PATIENT TYPE:  INP   LOCATION:  2899                                 FACILITY:  [facility]   PHYSICIAN:  Rose Phi. Maple Hudson, M.D.                DATE OF BIRTH:  08-27-1951   DATE OF PROCEDURE:  10/14/2003  DATE OF DISCHARGE:                                 OPERATIVE REPORT   PREOPERATIVE DIAGNOSIS:  Probable stage I carcinoma of the left breast,  multicentric.   POSTOPERATIVE DIAGNOSIS:  Probable stage I carcinoma of the left breast,  multicentric.   OPERATION:  1. Blue dye injection.  2. Left sentinel lymph node biopsy.  3. Left total mastectomy.  4. Left axillary node dissection.   SURGEON:  Dr. Francina Ames   ASSISTANT:  Dr. Cyndia Bent   ANESTHESIA:  General.   OPERATIVE PROCEDURE:  After suitable general anesthesia was induced, the  patient was placed in the supine position with the arms extended on the arm  board.  Prior to coming to the operating room, 1 mCi of technetium sulfur  colloid had been injected intradermally.  The early part of the case was  started as part of the tram reconstruction by Dr. Delia Chimes.  When I came  into the case, I injected 5 mL of a mixture of 2 mL of methylene blue and 3  mL of saline in the subareolar tissue and then massaged the breast for about  3 minutes.  We did a skin-sparing incision with a circular incision around  the nipple areolar complex and then a limb extended medially and the limb  extended laterally to give better exposure for the surgery.  Having made the  incisions, I then dissected the upper flap to near the clavicle, then  medially to the sternum, and inferiorly to the inframammary fold at the  rectus fascia and laterally to the latissimus dorsi muscle.   Prior to that, we had made a short transverse axillary incision with  dissection through the subcutaneous tissue to the clavipectoral fascia.   Two  sentinel nodes were identified deep to the clavipectoral fascia.  They were  both blue and hot, and these were removed and submitted to the pathologist  for evaluation.   Having made the flaps, we then began to dissect the breast from medial to  lateral.  At the time we reached the lateral margin of the pectoralis major  muscle, the pathologist reported that there were a number of atypical cells  in one of the two sentinel lymph nodes, and she was concerned as to whether  they would be metastatic and for that reason, I elected to do a completion  axillary node dissection.   As we dissected along the pectoralis major muscle, we retracted it and  exposed the pectoralis minor and incised along the margin of the pectoralis  minor and retracted it as we divided the clavipectoral fascia over the  axillary vein.  We then swept all the tissue that was behind the minor  muscle and inferior to the vein with the long thoracic and thoracodorsal  nerves being identified and preserved, nodal vessels clipped and divided.  Following the removal of the breast and the axillary content, we thoroughly  irrigated the field with saline.  Hemostasis obtained with the cautery.   Dr. Benna Dunks then returned to the case to complete the tram reconstruction  which will be reported in a separate note dictated by Rose Phi. Maple Hudson, M.D.                                               Rose Phi. Maple Hudson, M.D.    PRY/MEDQ  D:  10/14/2003  T:  10/14/2003  Job:  [id number]

## 2011-04-27 ENCOUNTER — Other Ambulatory Visit: Payer: Self-pay | Admitting: Oncology

## 2011-04-27 ENCOUNTER — Encounter (HOSPITAL_BASED_OUTPATIENT_CLINIC_OR_DEPARTMENT_OTHER): Payer: 59 | Admitting: Oncology

## 2011-04-27 DIAGNOSIS — Z170 Estrogen receptor positive status [ER+]: Secondary | ICD-10-CM

## 2011-04-27 DIAGNOSIS — C50419 Malignant neoplasm of upper-outer quadrant of unspecified female breast: Secondary | ICD-10-CM

## 2011-04-27 LAB — CBC WITH DIFFERENTIAL/PLATELET
BASO%: 0.5 % (ref 0.0–2.0)
Basophils Absolute: 0.1 10*3/uL (ref 0.0–0.1)
EOS%: 1 % (ref 0.0–7.0)
HCT: 41.4 % (ref 34.8–46.6)
HGB: 14.2 g/dL (ref 11.6–15.9)
LYMPH%: 27.6 % (ref 14.0–49.7)
MCH: 31.7 pg (ref 25.1–34.0)
MCHC: 34.3 g/dL (ref 31.5–36.0)
MCV: 92.5 fL (ref 79.5–101.0)
MONO%: 9.6 % (ref 0.0–14.0)
NEUT%: 61.3 % (ref 38.4–76.8)
Platelets: 267 10*3/uL (ref 145–400)
lymph#: 3 10*3/uL (ref 0.9–3.3)

## 2011-04-27 LAB — COMPREHENSIVE METABOLIC PANEL
ALT: 17 U/L (ref 0–35)
AST: 16 U/L (ref 0–37)
Alkaline Phosphatase: 79 U/L (ref 39–117)
BUN: 15 mg/dL (ref 6–23)
Calcium: 9.8 mg/dL (ref 8.4–10.5)
Chloride: 101 mEq/L (ref 96–112)
Creatinine, Ser: 0.69 mg/dL (ref 0.50–1.10)
Total Bilirubin: 0.2 mg/dL — ABNORMAL LOW (ref 0.3–1.2)

## 2011-04-27 LAB — VITAMIN D 25 HYDROXY (VIT D DEFICIENCY, FRACTURES): Vit D, 25-Hydroxy: 65 ng/mL (ref 30–89)

## 2011-05-04 ENCOUNTER — Other Ambulatory Visit: Payer: Self-pay | Admitting: Oncology

## 2011-05-04 ENCOUNTER — Encounter: Payer: 59 | Admitting: Oncology

## 2011-05-04 DIAGNOSIS — Z1231 Encounter for screening mammogram for malignant neoplasm of breast: Secondary | ICD-10-CM

## 2011-08-18 ENCOUNTER — Telehealth: Payer: Self-pay | Admitting: *Deleted

## 2011-08-18 NOTE — Telephone Encounter (Signed)
left message to inform the patient of the new date and time in 04-2012 

## 2011-11-01 ENCOUNTER — Ambulatory Visit
Admission: RE | Admit: 2011-11-01 | Discharge: 2011-11-01 | Disposition: A | Discharge status: Home / Self Care | Payer: 59 | Source: Ambulatory Visit | Attending: Oncology | Admitting: Oncology

## 2011-11-01 DIAGNOSIS — Z1231 Encounter for screening mammogram for malignant neoplasm of breast: Secondary | ICD-10-CM

## 2012-04-14 ENCOUNTER — Telehealth: Payer: Self-pay | Admitting: *Deleted

## 2012-04-14 NOTE — Telephone Encounter (Signed)
md out of the office on 05-18-2012 moved patient appointment to 05-22-2012 

## 2012-05-11 ENCOUNTER — Other Ambulatory Visit: Payer: 59 | Admitting: Lab

## 2012-05-15 ENCOUNTER — Other Ambulatory Visit (HOSPITAL_BASED_OUTPATIENT_CLINIC_OR_DEPARTMENT_OTHER): Payer: 59 | Admitting: Lab

## 2012-05-15 DIAGNOSIS — C50419 Malignant neoplasm of upper-outer quadrant of unspecified female breast: Secondary | ICD-10-CM

## 2012-05-15 LAB — CBC WITH DIFFERENTIAL/PLATELET
Basophils Absolute: 0 10*3/uL (ref 0.0–0.1)
EOS%: 1 % (ref 0.0–7.0)
HCT: 36.4 % (ref 34.8–46.6)
HGB: 12.4 g/dL (ref 11.6–15.9)
MCH: 31.4 pg (ref 25.1–34.0)
MCV: 92.2 fL (ref 79.5–101.0)
NEUT%: 74.4 % (ref 38.4–76.8)
lymph#: 1.3 10*3/uL (ref 0.9–3.3)

## 2012-05-15 LAB — COMPREHENSIVE METABOLIC PANEL (CC13)
AST: 22 U/L (ref 5–34)
BUN: 16 mg/dL (ref 7.0–26.0)
Calcium: 9.8 mg/dL (ref 8.4–10.4)
Chloride: 104 mEq/L (ref 98–107)
Creatinine: 0.8 mg/dL (ref 0.6–1.1)

## 2012-05-16 LAB — VITAMIN D 25 HYDROXY (VIT D DEFICIENCY, FRACTURES): Vit D, 25-Hydroxy: 62 ng/mL (ref 30–89)

## 2012-05-18 ENCOUNTER — Ambulatory Visit: Payer: 59 | Admitting: Oncology

## 2012-05-22 ENCOUNTER — Telehealth: Payer: Self-pay | Admitting: *Deleted

## 2012-05-22 ENCOUNTER — Ambulatory Visit (HOSPITAL_BASED_OUTPATIENT_CLINIC_OR_DEPARTMENT_OTHER): Payer: 59 | Admitting: Oncology

## 2012-05-22 VITALS — BP 130/70 | HR 67 | Temp 97.7°F | Resp 20 | Ht 67.2 in | Wt 180.2 lb

## 2012-05-22 DIAGNOSIS — C50319 Malignant neoplasm of lower-inner quadrant of unspecified female breast: Secondary | ICD-10-CM

## 2012-05-22 DIAGNOSIS — C50919 Malignant neoplasm of unspecified site of unspecified female breast: Secondary | ICD-10-CM

## 2012-05-22 DIAGNOSIS — Z1231 Encounter for screening mammogram for malignant neoplasm of breast: Secondary | ICD-10-CM

## 2012-05-22 DIAGNOSIS — Z170 Estrogen receptor positive status [ER+]: Secondary | ICD-10-CM

## 2012-05-22 NOTE — Progress Notes (Signed)
Hematology and Oncology Follow Up Visit  [name] [id number] November 16, 1950 61 y.o. 05/22/2012 11:26 PM   DIAGNOSIS:   Encounter Diagnosis  Name Primary?  . Malignant neoplasm of breast (female), unspecified site Yes     PAST THERAPY:  61 year old woman with history of ER-positive PR-negative breast cancer status post left modified radical mastectomy, TRAM flap reconstruction and enrollment on B. 30 protocol for which she received a.c. Followed by Taxol Gemzar and radiation completed July 2005. Also randomized on MA 27 study status post 5 years of anastrozole, she continues and is currently. Interim History:  Patient is doing well. She has no complaints. She is a enjoying herself and travels to [place] over January of this year. She is followed by her primary care Dr. Who has ordered her bone density test. She is due for followup mammogram which she will schedule herself.  Medications: I have reviewed the patient's current medications.  Allergies: No Known Allergies  Past Medical History, Surgical history, Social history, and Family History were reviewed and updated.  Review of Systems: Constitutional:  Negative for fever, chills, night sweats, anorexia, weight loss, pain. Cardiovascular: negative Respiratory: negative Neurological: negative Dermatological: negative ENT: negative Skin Gastrointestinal: negative Genito-Urinary: negative Hematological and Lymphatic: negative Breast: negative Musculoskeletal: negative Remaining ROS negative.  Physical Exam:  Blood pressure 130/70, pulse 67, temperature 97.7 F (36.5 C), resp. rate 20, height 5' 7.2" (1.707 m), weight 180 lb 3.2 oz (81.738 kg).  ECOG: 0  HEENT:  Sclerae anicteric, conjunctivae pink.  Oropharynx clear.  No mucositis or candidiasis.  Nodes:  No cervical, supraclavicular, or axillary lymphadenopathy palpated.  Breast Exam:  Right breast is benign.  No masses, discharge, skin change, or nipple inversion.  Left  breast isstatus post mastectomy with TRAM flap reconstruction, no evidence of recurrence.  Lungs:  Clear to auscultation bilaterally.  No crackles, rhonchi, or wheezes.  Heart:  Regular rate and rhythm.  Abdomen:  Soft, nontender.  Positive bowel sounds.  No organomegaly or masses palpated.  Musculoskeletal:  No focal spinal tenderness to palpation.  Extremities:  Benign.  No peripheral edema or cyanosis.  Skin:  Benign.  Neuro:  Nonfocal.    Lab Results: Lab Results  Component Value Date   WBC 7.0 05/15/2012   HGB 12.4 05/15/2012   HCT 36.4 05/15/2012   MCV 92.2 05/15/2012   PLT 218 05/15/2012     Chemistry      Component Value Date/Time   NA 139 05/15/2012 1022   NA 140 04/27/2011 1505   K 3.6 05/15/2012 1022   K 3.4* 04/27/2011 1505   CL 104 05/15/2012 1022   CL 101 04/27/2011 1505   CO2 24 05/15/2012 1022   CO2 28 04/27/2011 1505   BUN 16.0 05/15/2012 1022   BUN 15 04/27/2011 1505   CREATININE 0.8 05/15/2012 1022   CREATININE 0.69 04/27/2011 1505      Component Value Date/Time   CALCIUM 9.8 05/15/2012 1022   CALCIUM 9.8 04/27/2011 1505   ALKPHOS 69 05/15/2012 1022   ALKPHOS 79 04/27/2011 1505   AST 22 05/15/2012 1022   AST 16 04/27/2011 1505   ALT 21 05/15/2012 1022   ALT 17 04/27/2011 1505   BILITOT 0.60 05/15/2012 1022   BILITOT 0.2* 04/27/2011 1505       Radiological Studies:  No results found.   IMPRESSIONS AND PLAN: A 61 y.o. female with   History of ER positive breast cancer continues on Arimidex. She few appears to be free  of clinical recurrence. I will see her in a years time. I discussed enrollment in one of our new studies using dermatherapy sheets  Spent more than half the time coordinating care, as well as discussion of BMI and its implications.      [name] 9/23/201311:26 PM Cell [phone]

## 2012-05-22 NOTE — Telephone Encounter (Signed)
Gave patient appointment for 11-20-2012 at the breast center for mammogram  05-16-2013 lab only   05-24-2013 md appointment  Patient aware of all appointments

## 2012-11-18 ENCOUNTER — Telehealth: Payer: Self-pay | Admitting: Oncology

## 2012-11-18 ENCOUNTER — Encounter: Payer: Self-pay | Admitting: Oncology

## 2012-11-18 NOTE — Telephone Encounter (Signed)
Former [facility] pt reassigned to [employer] [facility] [employer]. S/w pt spouse re appt for 9/25 w/JH. Also confirmed 9/17 appt. Letter mailed.

## 2012-11-20 ENCOUNTER — Ambulatory Visit
Admission: RE | Admit: 2012-11-20 | Discharge: 2012-11-20 | Disposition: A | Discharge status: Home / Self Care | Payer: 59 | Source: Ambulatory Visit | Attending: Oncology | Admitting: Oncology

## 2012-11-20 DIAGNOSIS — Z1231 Encounter for screening mammogram for malignant neoplasm of breast: Secondary | ICD-10-CM

## 2013-05-15 ENCOUNTER — Other Ambulatory Visit: Payer: Self-pay | Admitting: Physician Assistant

## 2013-05-15 DIAGNOSIS — C50912 Malignant neoplasm of unspecified site of left female breast: Secondary | ICD-10-CM

## 2013-05-16 ENCOUNTER — Other Ambulatory Visit (HOSPITAL_BASED_OUTPATIENT_CLINIC_OR_DEPARTMENT_OTHER): Payer: 59 | Admitting: Lab

## 2013-05-16 DIAGNOSIS — C50919 Malignant neoplasm of unspecified site of unspecified female breast: Secondary | ICD-10-CM

## 2013-05-16 DIAGNOSIS — C50912 Malignant neoplasm of unspecified site of left female breast: Secondary | ICD-10-CM

## 2013-05-16 LAB — CBC WITH DIFFERENTIAL/PLATELET
BASO%: 0.9 % (ref 0.0–2.0)
EOS%: 0.6 % (ref 0.0–7.0)
LYMPH%: 20.9 % (ref 14.0–49.7)
MCH: 31.4 pg (ref 25.1–34.0)
MCHC: 34.3 g/dL (ref 31.5–36.0)
MCV: 91.8 fL (ref 79.5–101.0)
MONO#: 0.4 10*3/uL (ref 0.1–0.9)
MONO%: 6.6 % (ref 0.0–14.0)
Platelets: 259 10*3/uL (ref 145–400)
RBC: 4.16 10*6/uL (ref 3.70–5.45)
WBC: 6.6 10*3/uL (ref 3.9–10.3)

## 2013-05-16 LAB — COMPREHENSIVE METABOLIC PANEL (CC13)
ALT: 19 U/L (ref 0–55)
AST: 20 U/L (ref 5–34)
Albumin: 3.9 g/dL (ref 3.5–5.0)
Alkaline Phosphatase: 75 U/L (ref 40–150)
BUN: 17.1 mg/dL (ref 7.0–26.0)
CO2: 29 meq/L (ref 22–29)
Calcium: 9.3 mg/dL (ref 8.4–10.4)
Chloride: 104 meq/L (ref 98–109)
Creatinine: 0.8 mg/dL (ref 0.6–1.1)
Glucose: 114 mg/dL (ref 70–140)
Potassium: 3.9 meq/L (ref 3.5–5.1)
Sodium: 141 meq/L (ref 136–145)
Total Bilirubin: 0.38 mg/dL (ref 0.20–1.20)
Total Protein: 7.3 g/dL (ref 6.4–8.3)

## 2013-05-24 ENCOUNTER — Telehealth: Payer: Self-pay | Admitting: *Deleted

## 2013-05-24 ENCOUNTER — Encounter: Payer: Self-pay | Admitting: Family

## 2013-05-24 ENCOUNTER — Ambulatory Visit: Payer: 59 | Admitting: Oncology

## 2013-05-24 ENCOUNTER — Ambulatory Visit (HOSPITAL_BASED_OUTPATIENT_CLINIC_OR_DEPARTMENT_OTHER): Payer: 59 | Admitting: Family

## 2013-05-24 VITALS — BP 161/74 | HR 65 | Temp 98.3°F | Resp 18 | Ht 67.2 in | Wt 182.8 lb

## 2013-05-24 DIAGNOSIS — Z853 Personal history of malignant neoplasm of breast: Secondary | ICD-10-CM | POA: Insufficient documentation

## 2013-05-24 NOTE — Telephone Encounter (Signed)
Wrong chart- no entry.

## 2013-05-24 NOTE — Patient Instructions (Addendum)
Please contact us at [phone] if you have any questions or concerns.  Please continue to do well and enjoy life!!!  Get plenty of rest, drink plenty of water, exercise daily, eat a balanced diet.  Continue to take calcium and vitamin D3 daily.  Complete monthly self-breast examinations.  Have a clinical breast exam by a physician every year.  Have your mammogram completed every year.  For personal comfort: Vitamin E tablets insert nightly or as needed Coconut oil apply daily or as needed Replens Astroglide  OTC Peridin-C - hot flashes/night sweats   Results for orders placed in visit on 05/16/13 (from the past 336 hour(s))  CBC WITH DIFFERENTIAL   Collection Time    05/16/13 10:11 AM      Result Value Range   WBC 6.6  3.9 - 10.3 10e3/uL   NEUT# 4.7  1.5 - 6.5 10e3/uL   HGB 13.1  11.6 - 15.9 g/dL   HCT 16.1  09.6 - 04.5 %   Platelets 259  145 - 400 10e3/uL   MCV 91.8  79.5 - 101.0 fL   MCH 31.4  25.1 - 34.0 pg   MCHC 34.3  31.5 - 36.0 g/dL   RBC 4.09  8.11 - 9.14 10e6/uL   RDW 12.8  11.2 - 14.5 %   lymph# 1.4  0.9 - 3.3 10e3/uL   MONO# 0.4  0.1 - 0.9 10e3/uL   Eosinophils Absolute 0.0  0.0 - 0.5 10e3/uL   Basophils Absolute 0.1  0.0 - 0.1 10e3/uL   NEUT% 71.0  38.4 - 76.8 %   LYMPH% 20.9  14.0 - 49.7 %   MONO% 6.6  0.0 - 14.0 %   EOS% 0.6  0.0 - 7.0 %   BASO% 0.9  0.0 - 2.0 %  COMPREHENSIVE METABOLIC PANEL (CC13)   Collection Time    05/16/13 10:11 AM      Result Value Range   Sodium 141  136 - 145 mEq/L   Potassium 3.9  3.5 - 5.1 mEq/L   Chloride 104  98 - 109 mEq/L   CO2 29  22 - 29 mEq/L   Glucose 114  70 - 140 mg/dl   BUN 78.2  7.0 - 95.6 mg/dL   Creatinine 0.8  0.6 - 1.1 mg/dL   Total Bilirubin 2.13  0.20 - 1.20 mg/dL   Alkaline Phosphatase 75  40 - 150 U/L   AST 20  5 - 34 U/L   ALT 19  0 - 55 U/L   Total Protein 7.3  6.4 - 8.3 g/dL   Albumin 3.9  3.5 - 5.0 g/dL   Calcium 9.3  8.4 - 08.6 mg/dL

## 2013-05-24 NOTE — Progress Notes (Addendum)
[name]  [phone] [phone]  OFFICE PROGRESS NOTE   ID: [name]   DOB: Dec 29, 1950  MR#: [id number]  BMW#:[id number]   PCP: Carmen Pounds, MD SU: [name], M.D. PLA SU: [name], M.D.   HISTORY OF PRESENT ILLNESS: From Dr. Pierce Gray patient evaluation note dated 11/01/2003: "[name] is a pleasant 62 year old woman from [place].  [name] is a pleasant woman who has been in excellent health all of her life. She has a history of minor arthritis.  She undergoes routine scanning mammography on an annual basis and typically does do self-examination. She had her regular mammogram in January of this year, 09/12/03.  This showed calcifications of the left breast.  Right breast was normal.  Because of the abnormal appearance of the calcification, a biopsy was recommended. This was performed on 10/04/03.  Pathology showed inframammary carcinoma of the lower outer quadrant.  Bilateral MRI scans were recommended.  The left MRI shows a 17 x 13 x 14 mm irregular enhancing mass within the upper outer quadrant corresponding to the recent biopsy location with an adjacent 8 mm mass, slightly superior to the nipple, and an additional 8 mm irregular enhancing mass seen within the subareolar aspect of the left breast.  There were two irregular enhancing masses within the left upper outer quadrant measuring 12 and 9 mm respectively.    The biopsy performed on 10/04/03 showed an invasive mammary carcinoma.  Original biopsy was done on 09/18/03 of the left breast. This showed DCIS intermediate to high grade.  Due to the presence of both DCIS and invasive cancer in a multicentric pattern, it was recommended she undergo mastectomy which she did, with immediate reconstruction with a TRAM flap on 10/14/03. The final pathology showed invasive ductal carcinoma with two foci measuring 1.5 cm and 1.2 cm.  Superior margins were not involved.  Margins were clear.  Lymphovascular  invasion was present focally.  Final grade was intermediate.  Estrogen receptor was positive 89% and progesterone receptor was negative at 0%.  Ki-67 was 8% and HER-2 was 2+, negative by FISH.    The original sentinel lymph node did have very atypical cells, suspicious for carcinoma.  An additional eight lymph nodes were identified at the time of mastectomy, one of which had metastatic disease.  A total of 2 out of 10 lymph nodes had metastatic disease.  [name] has had a fairly good and unremarkable postoperative course. She was helped by some intra-operative placement of pain catheters in the actual abdominal TRAM site.  Her postoperative course has been noted to be relatively uncomplicated."  Her subsequent history is as detailed below.   INTERVAL HISTORY: Dr. Darnelle Gray and I saw [name] for followup of invasive and in situ ductal carcinoma of the left breast, status post mastectomy.  The patient was last seen by Dr. Donnie Gray on 05/22/2012.  Since her last office visit, the patient has been doing relatively well.  She was also visited by a research assistant [name] and Nurse, mental health during Gray's office visit. She is establishing herself with Dr. Darrall Gray service Gray.  REVIEW OF SYSTEMS: A 10 point review of systems was completed and is negative except ongoing diffuse joint aches, hot flashes, night sweats, vaginal dryness, and dyspareunia.  Various over-the-counter remedies for vaginal dryness/hot flashes/night sweats was discussed with [name].  She was also given information regarding our pelvic discomfort clinic offered by [facility].  Ms. Gray denies any other  symptomatology including fatigue, fever or chills, headache, vision changes, swollen glands, cough or shortness of breath, chest pain or discomfort, nausea, vomiting, diarrhea, constipation, change in urinary or bowel habits, other arthralgias/myalgias, unusual bleeding/bruising or any other symptomatology.   PAST  MEDICAL HISTORY: Past Medical History  Diagnosis Date  . Breast cancer 08/2003    S/p left breast mastectomy  . Arthritis   . Carpal tunnel syndrome, bilateral     PAST SURGICAL HISTORY: Past Surgical History  Procedure Laterality Date  . Mastectomy with axillary lymph node dissection Left 10/14/2003  . Carpal tunnel surgery Bilateral     FAMILY HISTORY Family History  Problem Relation Age of Onset  . Alzheimer's disease Mother   . Seizures Mother     GYNECOLOGIC HISTORY: Gravida 2, para 2, age of menarche 89, age of parity 41, age of menopause 89, she had a short course of Prempro.  Prior to that, she had an IUD for 15 years and was on birth control pills until age 35.  After having her IUD removed, she was on birth control pills for another 2 years.  SOCIAL HISTORY: Mr. and Mrs. [name] has been married since 1968.  They have 2 adult children a son and a daughter.  Her husband [name] works as a Forensic scientist.  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  In her spare time she enjoys [place], spending time with her family, and traveling.   ADVANCED DIRECTIVES: In place  HEALTH MAINTENANCE: History  Substance Use Topics  . Smoking status: Never Smoker   . Smokeless tobacco: Never Used  . Alcohol Use: 0.0 oz/week    5-7 Glasses of wine per week    Colonoscopy: Not on file PAP: Not on file Bone density: The patient's last bone density scan on record on 08/07/2007 showed a T score of -0.1 (normal).  She states she had a bone density scan with her primary care physician Dr. Timothy Lasso in 08/2012. Lipid panel: Not on file  No Known Allergies  Current Outpatient Prescriptions  Medication Sig Dispense Refill  . calcium carbonate (OS-CAL) 600 MG TABS Take 600 mg by mouth 2 (two) times daily with a meal.      . Cholecalciferol (VITAMIN D) 2000 UNITS tablet Take 2,000 Units by mouth daily.      Marland Kitchen ECHINACEA PO Take 2 tablets by mouth as  needed.      Marland Kitchen glucosamine-chondroitin 500-400 MG tablet Take 1 tablet by mouth 2 (two) times daily.       Boris Lown Oil 500 MG CAPS Take by mouth.      . Multiple Vitamins-Minerals (MULTIVITAMIN WITH MINERALS) tablet Take 1 tablet by mouth daily.      . vitamin C (ASCORBIC ACID) 250 MG tablet Take 250 mg by mouth as needed.        No current facility-administered medications for this visit.    OBJECTIVE: Filed Vitals:   05/24/13 1124  BP: 161/74  Pulse: 65  Temp: 98.3 F (36.8 C)  Resp: 18     Body mass index is 28.46 kg/(m^2).      ECOG FS: 1 - Symptomatic but completely ambulatory  General appearance: Alert, cooperative, well nourished, no apparent distress Head: Normocephalic, without obvious abnormality, atraumatic Eyes: Conjunctivae/corneas clear, PERRLA, EOMI Nose: Nares, septum and mucosa are normal, no drainage or sinus tenderness Neck: No adenopathy, supple, symmetrical, trachea midline, no tenderness Resp: Clear to auscultation bilaterally, no wheezes/rales/rhonchi Cardio:  Regular rate and rhythm, S1, S2 normal, no murmur, click, rub or gallop, no edema Breasts:  Left breast has been reconstructed and has well-healed surgical scars, left axillary area has well-healed surgical scars, glandular tissue bilaterally, bilateral axillary fullness  GI: Soft, distended, non-tender, hypoactive bowel sounds, no organomegaly Skin: No rashes/lesions, skin warm and dry, no erythematous areas, no cyanosis, numerous nevi on trunk/cervical area/extremities M/S:  Atraumatic, normal strength in all extremities, normal range of motion, no clubbing  Lymph nodes: Cervical, supraclavicular, and axillary nodes normal Neurologic: Grossly normal, cranial nerves II through XII intact, alert and oriented x 3 Psych: Appropriate affect   LAB RESULTS: Lab Results  Component Value Date   WBC 6.6 05/16/2013   NEUTROABS 4.7 05/16/2013   HGB 13.1 05/16/2013   HCT 38.2 05/16/2013   MCV 91.8 05/16/2013    PLT 259 05/16/2013      Chemistry      Component Value Date/Time   NA 141 05/16/2013 1011   NA 140 04/27/2011 1505   K 3.9 05/16/2013 1011   K 3.4* 04/27/2011 1505   CL 104 05/15/2012 1022   CL 101 04/27/2011 1505   CO2 29 05/16/2013 1011   CO2 28 04/27/2011 1505   BUN 17.1 05/16/2013 1011   BUN 15 04/27/2011 1505   CREATININE 0.8 05/16/2013 1011   CREATININE 0.69 04/27/2011 1505      Component Value Date/Time   CALCIUM 9.3 05/16/2013 1011   CALCIUM 9.8 04/27/2011 1505   ALKPHOS 75 05/16/2013 1011   ALKPHOS 79 04/27/2011 1505   AST 20 05/16/2013 1011   AST 16 04/27/2011 1505   ALT 19 05/16/2013 1011   ALT 17 04/27/2011 1505   BILITOT 0.38 05/16/2013 1011   BILITOT 0.2* 04/27/2011 1505       Lab Results  Component Value Date   LABCA2 18 04/23/2010    Urinalysis No results found for this basename: colorurine,  appearanceur,  labspec,  phurine,  glucoseu,  hgbur,  bilirubinur,  ketonesur,  proteinur,  urobilinogen,  nitrite,  leukocytesur     STUDIES: 1.  Mm Digital Screening 11/20/2012   *RADIOLOGY REPORT*  Clinical Data:  Screening. Malignant mastectomy of the left breast in 2005 with TRAM flap reconstruction.  RIGHT DIGITAL SCREENING MAMMOGRAM WITH CAD  Comparison:  Previous examinations  FINDINGS:  ACR Breast Density Category 3: The breast tissue is heterogeneously dense.  The patient has had a left mastectomy.  No suspicious masses, architectural distortion, or calcifications are present. There is a left-sided TRAM flap present.  There are no worrisome findings associated with the TRAM flap.  Images were processed with CAD.  IMPRESSION: No evidence of malignancy.  Screening mammography is recommended in one year.  BI-RADS CATEGORY 1:  Negative.   Original Report Authenticated By: Rolla Plate, M.D.   2.  The patient's last bone density scan on 08/07/2007 showed a T score of -0.1 (normal).    ASSESSMENT: 62 y.o. [place], [place]: 1. Status post left breast needle core  biopsy on 09/18/2003 which showed ductal carcinoma in situ, intermediate high grade with necrosis and calcifications, no invasive ductal carcinoma identified, prognostic panel not provided.  2.  The patient had bilateral breast MRI on 10/01/2003 which showed no abnormal masses or enhancement are present within the right breast.  No right axillary adenopathy is seen.  On the left, there is a 17 x 13 x 14 mm irregular enhancing mass within the upper outer quadrant which correlates with the region  of recently biopsied DCIS.  There is an adjacent 8 mm mass directly superior to the nipple located adjacent to the larger mass.  An additional 8 mm irregular enhancing mass is seen within the subareolar aspect of the left breast.  There are two irregular enhancing masses within the left lower outer quadrant which measured 12 mm  and 9 mm.  No left axillary adenopathy is seen (clinical stage I, T1 N0).  3.  Status post left breast lower inner quadrant needle core biopsy on 10/02/2003 which showed invasive mammary carcinoma, that appeared to be an invasive mammary carcinoma ductal type with at least intermediate grade nuclear features, prognostic panel not provided.  4.  Status post left breast mastectomy with left axillary sentinel node biopsy on 10/14/2003 for a stage I, pT1c, pN1a, pMX, 2 foci of 1.5 cm in lower medial quadrant and 1.2 cm and lower lateral quadrant of invasive and in situ ductal carcinoma, margins not involved, invasive component intermediate grade, in situ component intermediate grade, estrogen receptor 89% positive, progesterone receptor negative, Ki-67 8%, HER-2/neu 2+ borderline, with 2/10 metastatic left axillary lymph nodes.  TRAM flap reconstructive surgery.  5.  The patient became a participant in the NSABP B-38 protocol in 2005.  She received adjuvant chemotherapy with 4 cycles of dose dense AC (Adriamycin/Cytoxan) with Neulasta for support from 11/29/2003 through 01/13/2004.  She was  randomized to receive adjuvant chemotherapy with Gemzar/Taxol with Neulasta support x through 03/09/2004 4 cycles from 01/27/2004.  6.  The patient started antiestrogen therapy with Arimidex in 03/2004 as part of the MA.27 protocol.  Antiestrogen therapy continued until Gray, 05/24/2013.   PLAN: The patient is over 9  years from her time of diagnosis and will officially become a graduate of [facility]'s breast cancer program Gray.  We asked that she continue annual clinical breast examinations by a physician in addition to annual mammography.  Her most recent mammogram results are listed above.  All questions were answered.  The patient was encouraged to contact us with any problems, questions or concerns.   Larina Bras, NP-C 05/25/2013, 9:56 AM  ADDENDUM: I met with this 28 year old [place] woman Gray to review her diagnosis, treatment history, and prognosis. In brief:  She underwent left breast biopsy January of 2005 showing only ductal carcinoma in situ. Breast MRI however suggested multifocal disease, and this was confirmed February of 2005 with a left breast lower inner quadrant biopsy which showed invasion.  Accordingly February of 2005 she underwent left mastectomy and axillary lymph node dissection for an mpT1c pN1a, stage IIA invasive ductal carcinoma, grade 2, which was estrogen receptor positive, progesterone receptor negative, with an MIB-1 of 8% and the immediate TRAM reconstruction under [name].  The patient been enrolled in NSABP B-38 and received adjuvant doxorubicin and cyclophosphamide and dose dense fashion at standard doses for 4 cycles followed by gemcitabine and docetaxel for 4 cycles, completed in July of 2005. She then enrolled in the MA-27 study and was randomized to anastrozole, which she started August of 2005. She has tolerated the anastrozole well and her most recent bone density, 2008, was normal.  We discussed the fact that our data for aromatase  inhibitors stops at 5 years. We do not have data that continuing is helpful or that it is harmful or neutral. I generally stop my patients at 5 years and she has already received 9 years of anastrozole. Accordingly I feel comfortable releasing her to her primary care physician  The patient understands she  will need yearly mammography and a yearly physician breast exam. We are sending her primary care physician a letter with that information. We will be glad to see me again at anytime in the future if and when the need arises, but as of now we are making no further routine appointments for her here. She has a good understanding of this plan, and is very much in agreement with it.  I personally saw this patient and performed a substantive portion of this encounter with the listed APP documented above.   Lowella Dell, MD

## 2013-05-25 ENCOUNTER — Encounter: Payer: Self-pay | Admitting: Family

## 2013-07-05 ENCOUNTER — Other Ambulatory Visit: Payer: Self-pay

## 2013-10-17 ENCOUNTER — Other Ambulatory Visit: Payer: Self-pay

## 2013-10-17 DIAGNOSIS — Z9012 Acquired absence of left breast and nipple: Secondary | ICD-10-CM

## 2013-10-17 DIAGNOSIS — Z1231 Encounter for screening mammogram for malignant neoplasm of breast: Secondary | ICD-10-CM

## 2013-11-21 ENCOUNTER — Ambulatory Visit: Payer: 59

## 2013-11-23 ENCOUNTER — Ambulatory Visit
Admission: RE | Admit: 2013-11-23 | Discharge: 2013-11-23 | Disposition: A | Discharge status: Home / Self Care | Payer: Self-pay | Source: Ambulatory Visit

## 2013-11-23 DIAGNOSIS — Z1231 Encounter for screening mammogram for malignant neoplasm of breast: Secondary | ICD-10-CM

## 2013-11-23 DIAGNOSIS — Z9012 Acquired absence of left breast and nipple: Secondary | ICD-10-CM

## 2014-05-23 DIAGNOSIS — Z23 Encounter for immunization: Secondary | ICD-10-CM | POA: Diagnosis not present

## 2014-05-28 DIAGNOSIS — Z1211 Encounter for screening for malignant neoplasm of colon: Secondary | ICD-10-CM | POA: Diagnosis not present

## 2014-05-28 DIAGNOSIS — K648 Other hemorrhoids: Secondary | ICD-10-CM | POA: Diagnosis not present

## 2014-06-13 DIAGNOSIS — H43819 Vitreous degeneration, unspecified eye: Secondary | ICD-10-CM | POA: Diagnosis not present

## 2014-06-13 DIAGNOSIS — H11441 Conjunctival cysts, right eye: Secondary | ICD-10-CM | POA: Diagnosis not present

## 2014-06-13 DIAGNOSIS — H25813 Combined forms of age-related cataract, bilateral: Secondary | ICD-10-CM | POA: Diagnosis not present

## 2014-09-05 DIAGNOSIS — L820 Inflamed seborrheic keratosis: Secondary | ICD-10-CM | POA: Diagnosis not present

## 2014-10-22 DIAGNOSIS — J209 Acute bronchitis, unspecified: Secondary | ICD-10-CM | POA: Diagnosis not present

## 2014-10-22 DIAGNOSIS — Z6828 Body mass index (BMI) 28.0-28.9, adult: Secondary | ICD-10-CM | POA: Diagnosis not present

## 2014-10-22 DIAGNOSIS — R05 Cough: Secondary | ICD-10-CM | POA: Diagnosis not present

## 2014-11-26 ENCOUNTER — Other Ambulatory Visit: Payer: Self-pay

## 2014-11-26 DIAGNOSIS — Z9012 Acquired absence of left breast and nipple: Secondary | ICD-10-CM

## 2014-11-26 DIAGNOSIS — Z1231 Encounter for screening mammogram for malignant neoplasm of breast: Secondary | ICD-10-CM

## 2014-12-04 ENCOUNTER — Ambulatory Visit
Admission: RE | Admit: 2014-12-04 | Discharge: 2014-12-04 | Disposition: A | Discharge status: Home / Self Care | Payer: Medicare Other | Source: Ambulatory Visit

## 2014-12-04 DIAGNOSIS — Z1231 Encounter for screening mammogram for malignant neoplasm of breast: Secondary | ICD-10-CM

## 2014-12-04 DIAGNOSIS — Z9012 Acquired absence of left breast and nipple: Secondary | ICD-10-CM

## 2015-02-25 DIAGNOSIS — E780 Pure hypercholesterolemia: Secondary | ICD-10-CM | POA: Diagnosis not present

## 2015-02-25 DIAGNOSIS — E559 Vitamin D deficiency, unspecified: Secondary | ICD-10-CM | POA: Diagnosis not present

## 2015-03-10 DIAGNOSIS — Z23 Encounter for immunization: Secondary | ICD-10-CM | POA: Diagnosis not present

## 2015-03-10 DIAGNOSIS — M1990 Unspecified osteoarthritis, unspecified site: Secondary | ICD-10-CM | POA: Diagnosis not present

## 2015-03-10 DIAGNOSIS — E559 Vitamin D deficiency, unspecified: Secondary | ICD-10-CM | POA: Diagnosis not present

## 2015-03-10 DIAGNOSIS — Z1389 Encounter for screening for other disorder: Secondary | ICD-10-CM | POA: Diagnosis not present

## 2015-03-10 DIAGNOSIS — Z0000 Encounter for general adult medical examination without abnormal findings: Secondary | ICD-10-CM | POA: Diagnosis not present

## 2015-03-10 DIAGNOSIS — E780 Pure hypercholesterolemia: Secondary | ICD-10-CM | POA: Diagnosis not present

## 2015-03-10 DIAGNOSIS — Z6828 Body mass index (BMI) 28.0-28.9, adult: Secondary | ICD-10-CM | POA: Diagnosis not present

## 2015-03-10 DIAGNOSIS — Z853 Personal history of malignant neoplasm of breast: Secondary | ICD-10-CM | POA: Diagnosis not present

## 2015-03-10 DIAGNOSIS — K30 Functional dyspepsia: Secondary | ICD-10-CM | POA: Diagnosis not present

## 2015-03-11 DIAGNOSIS — Z1212 Encounter for screening for malignant neoplasm of rectum: Secondary | ICD-10-CM | POA: Diagnosis not present

## 2015-04-24 DIAGNOSIS — L739 Follicular disorder, unspecified: Secondary | ICD-10-CM | POA: Diagnosis not present

## 2015-04-24 DIAGNOSIS — K30 Functional dyspepsia: Secondary | ICD-10-CM | POA: Diagnosis not present

## 2015-04-24 DIAGNOSIS — R109 Unspecified abdominal pain: Secondary | ICD-10-CM | POA: Diagnosis not present

## 2015-04-24 DIAGNOSIS — Z6828 Body mass index (BMI) 28.0-28.9, adult: Secondary | ICD-10-CM | POA: Diagnosis not present

## 2015-04-28 DIAGNOSIS — R1032 Left lower quadrant pain: Secondary | ICD-10-CM | POA: Diagnosis not present

## 2015-04-28 DIAGNOSIS — N281 Cyst of kidney, acquired: Secondary | ICD-10-CM | POA: Diagnosis not present

## 2015-07-29 DIAGNOSIS — Z23 Encounter for immunization: Secondary | ICD-10-CM | POA: Diagnosis not present

## 2015-08-07 DIAGNOSIS — H5201 Hypermetropia, right eye: Secondary | ICD-10-CM | POA: Diagnosis not present

## 2015-08-07 DIAGNOSIS — H25813 Combined forms of age-related cataract, bilateral: Secondary | ICD-10-CM | POA: Diagnosis not present

## 2015-08-07 DIAGNOSIS — H524 Presbyopia: Secondary | ICD-10-CM | POA: Diagnosis not present

## 2015-08-07 DIAGNOSIS — H5212 Myopia, left eye: Secondary | ICD-10-CM | POA: Diagnosis not present

## 2015-08-07 DIAGNOSIS — H52221 Regular astigmatism, right eye: Secondary | ICD-10-CM | POA: Diagnosis not present

## 2016-01-13 ENCOUNTER — Other Ambulatory Visit: Payer: Self-pay

## 2016-01-13 DIAGNOSIS — Z1231 Encounter for screening mammogram for malignant neoplasm of breast: Secondary | ICD-10-CM

## 2016-01-13 DIAGNOSIS — Z9012 Acquired absence of left breast and nipple: Secondary | ICD-10-CM

## 2016-01-29 ENCOUNTER — Ambulatory Visit
Admission: RE | Admit: 2016-01-29 | Discharge: 2016-01-29 | Disposition: A | Discharge status: Home / Self Care | Payer: Medicare Other | Source: Ambulatory Visit

## 2016-01-29 DIAGNOSIS — Z1231 Encounter for screening mammogram for malignant neoplasm of breast: Secondary | ICD-10-CM | POA: Diagnosis not present

## 2016-01-29 DIAGNOSIS — Z9012 Acquired absence of left breast and nipple: Secondary | ICD-10-CM

## 2016-03-16 DIAGNOSIS — E559 Vitamin D deficiency, unspecified: Secondary | ICD-10-CM | POA: Diagnosis not present

## 2016-03-16 DIAGNOSIS — E7800 Pure hypercholesterolemia, unspecified: Secondary | ICD-10-CM | POA: Diagnosis not present

## 2016-03-23 DIAGNOSIS — Z1389 Encounter for screening for other disorder: Secondary | ICD-10-CM | POA: Diagnosis not present

## 2016-03-23 DIAGNOSIS — E559 Vitamin D deficiency, unspecified: Secondary | ICD-10-CM | POA: Diagnosis not present

## 2016-03-23 DIAGNOSIS — E7800 Pure hypercholesterolemia, unspecified: Secondary | ICD-10-CM | POA: Diagnosis not present

## 2016-03-23 DIAGNOSIS — Z0000 Encounter for general adult medical examination without abnormal findings: Secondary | ICD-10-CM | POA: Diagnosis not present

## 2016-03-23 DIAGNOSIS — R05 Cough: Secondary | ICD-10-CM | POA: Diagnosis not present

## 2016-03-23 DIAGNOSIS — Z6828 Body mass index (BMI) 28.0-28.9, adult: Secondary | ICD-10-CM | POA: Diagnosis not present

## 2016-03-23 DIAGNOSIS — Z853 Personal history of malignant neoplasm of breast: Secondary | ICD-10-CM | POA: Diagnosis not present

## 2016-03-23 DIAGNOSIS — M1990 Unspecified osteoarthritis, unspecified site: Secondary | ICD-10-CM | POA: Diagnosis not present

## 2016-03-26 DIAGNOSIS — Z1212 Encounter for screening for malignant neoplasm of rectum: Secondary | ICD-10-CM | POA: Diagnosis not present

## 2016-04-22 DIAGNOSIS — Z780 Asymptomatic menopausal state: Secondary | ICD-10-CM | POA: Diagnosis not present

## 2016-04-22 DIAGNOSIS — E559 Vitamin D deficiency, unspecified: Secondary | ICD-10-CM | POA: Diagnosis not present

## 2016-06-14 DIAGNOSIS — Z23 Encounter for immunization: Secondary | ICD-10-CM | POA: Diagnosis not present

## 2016-08-26 DIAGNOSIS — H524 Presbyopia: Secondary | ICD-10-CM | POA: Diagnosis not present

## 2016-08-26 DIAGNOSIS — H25813 Combined forms of age-related cataract, bilateral: Secondary | ICD-10-CM | POA: Diagnosis not present

## 2016-08-26 DIAGNOSIS — H52223 Regular astigmatism, bilateral: Secondary | ICD-10-CM | POA: Diagnosis not present

## 2016-08-26 DIAGNOSIS — H5212 Myopia, left eye: Secondary | ICD-10-CM | POA: Diagnosis not present

## 2016-08-26 DIAGNOSIS — H5201 Hypermetropia, right eye: Secondary | ICD-10-CM | POA: Diagnosis not present

## 2016-12-27 ENCOUNTER — Other Ambulatory Visit: Payer: Self-pay | Admitting: Internal Medicine

## 2016-12-27 DIAGNOSIS — Z1231 Encounter for screening mammogram for malignant neoplasm of breast: Secondary | ICD-10-CM

## 2017-02-07 ENCOUNTER — Encounter: Payer: Self-pay | Admitting: Radiology

## 2017-02-07 ENCOUNTER — Ambulatory Visit
Admission: RE | Admit: 2017-02-07 | Discharge: 2017-02-07 | Disposition: A | Discharge status: Home / Self Care | Payer: PPO | Source: Ambulatory Visit | Attending: Internal Medicine | Admitting: Internal Medicine

## 2017-02-07 DIAGNOSIS — Z1231 Encounter for screening mammogram for malignant neoplasm of breast: Secondary | ICD-10-CM

## 2017-03-22 DIAGNOSIS — N390 Urinary tract infection, site not specified: Secondary | ICD-10-CM | POA: Diagnosis not present

## 2017-03-22 DIAGNOSIS — R8299 Other abnormal findings in urine: Secondary | ICD-10-CM | POA: Diagnosis not present

## 2017-03-22 DIAGNOSIS — E559 Vitamin D deficiency, unspecified: Secondary | ICD-10-CM | POA: Diagnosis not present

## 2017-03-22 DIAGNOSIS — E7800 Pure hypercholesterolemia, unspecified: Secondary | ICD-10-CM | POA: Diagnosis not present

## 2017-03-28 DIAGNOSIS — M1990 Unspecified osteoarthritis, unspecified site: Secondary | ICD-10-CM | POA: Diagnosis not present

## 2017-03-28 DIAGNOSIS — L723 Sebaceous cyst: Secondary | ICD-10-CM | POA: Diagnosis not present

## 2017-03-28 DIAGNOSIS — Z1389 Encounter for screening for other disorder: Secondary | ICD-10-CM | POA: Diagnosis not present

## 2017-03-28 DIAGNOSIS — Z0000 Encounter for general adult medical examination without abnormal findings: Secondary | ICD-10-CM | POA: Diagnosis not present

## 2017-03-28 DIAGNOSIS — R05 Cough: Secondary | ICD-10-CM | POA: Diagnosis not present

## 2017-03-28 DIAGNOSIS — Z853 Personal history of malignant neoplasm of breast: Secondary | ICD-10-CM | POA: Diagnosis not present

## 2017-03-28 DIAGNOSIS — E559 Vitamin D deficiency, unspecified: Secondary | ICD-10-CM | POA: Diagnosis not present

## 2017-03-28 DIAGNOSIS — E784 Other hyperlipidemia: Secondary | ICD-10-CM | POA: Diagnosis not present

## 2017-03-28 DIAGNOSIS — Z6828 Body mass index (BMI) 28.0-28.9, adult: Secondary | ICD-10-CM | POA: Diagnosis not present

## 2017-04-01 DIAGNOSIS — Z1212 Encounter for screening for malignant neoplasm of rectum: Secondary | ICD-10-CM | POA: Diagnosis not present

## 2017-06-29 DIAGNOSIS — T148XXA Other injury of unspecified body region, initial encounter: Secondary | ICD-10-CM | POA: Diagnosis not present

## 2017-06-29 DIAGNOSIS — S82292A Other fracture of shaft of left tibia, initial encounter for closed fracture: Secondary | ICD-10-CM | POA: Diagnosis not present

## 2017-06-29 DIAGNOSIS — Z79899 Other long term (current) drug therapy: Secondary | ICD-10-CM | POA: Diagnosis not present

## 2017-06-29 DIAGNOSIS — G8911 Acute pain due to trauma: Secondary | ICD-10-CM | POA: Diagnosis not present

## 2017-06-29 DIAGNOSIS — S82492A Other fracture of shaft of left fibula, initial encounter for closed fracture: Secondary | ICD-10-CM | POA: Diagnosis not present

## 2017-06-29 DIAGNOSIS — S82402A Unspecified fracture of shaft of left fibula, initial encounter for closed fracture: Secondary | ICD-10-CM | POA: Diagnosis not present

## 2017-06-29 DIAGNOSIS — Z853 Personal history of malignant neoplasm of breast: Secondary | ICD-10-CM | POA: Diagnosis not present

## 2017-06-29 DIAGNOSIS — M25572 Pain in left ankle and joints of left foot: Secondary | ICD-10-CM | POA: Diagnosis not present

## 2017-06-29 DIAGNOSIS — S82202A Unspecified fracture of shaft of left tibia, initial encounter for closed fracture: Secondary | ICD-10-CM | POA: Diagnosis not present

## 2017-07-01 ENCOUNTER — Inpatient Hospital Stay (HOSPITAL_COMMUNITY): Payer: PPO

## 2017-07-01 ENCOUNTER — Encounter (HOSPITAL_COMMUNITY)
Admission: AD | Disposition: A | Discharge status: Home / Self Care | Payer: Self-pay | Source: Ambulatory Visit | Attending: Orthopaedic Surgery

## 2017-07-01 ENCOUNTER — Ambulatory Visit (INDEPENDENT_AMBULATORY_CARE_PROVIDER_SITE_OTHER): Payer: PPO

## 2017-07-01 ENCOUNTER — Observation Stay (HOSPITAL_COMMUNITY)
Admission: AD | Admit: 2017-07-01 | Discharge: 2017-07-02 | Disposition: A | Discharge status: Home / Self Care | Payer: PPO | Source: Ambulatory Visit | Attending: Orthopaedic Surgery | Admitting: Orthopaedic Surgery

## 2017-07-01 ENCOUNTER — Inpatient Hospital Stay (HOSPITAL_COMMUNITY): Payer: PPO | Admitting: Anesthesiology

## 2017-07-01 ENCOUNTER — Other Ambulatory Visit (INDEPENDENT_AMBULATORY_CARE_PROVIDER_SITE_OTHER): Payer: Self-pay | Admitting: Orthopaedic Surgery

## 2017-07-01 ENCOUNTER — Encounter (INDEPENDENT_AMBULATORY_CARE_PROVIDER_SITE_OTHER): Payer: Self-pay | Admitting: Orthopaedic Surgery

## 2017-07-01 ENCOUNTER — Ambulatory Visit (INDEPENDENT_AMBULATORY_CARE_PROVIDER_SITE_OTHER): Payer: PPO | Admitting: Orthopaedic Surgery

## 2017-07-01 ENCOUNTER — Encounter (HOSPITAL_COMMUNITY): Payer: Self-pay | Admitting: *Deleted

## 2017-07-01 DIAGNOSIS — S82302A Unspecified fracture of lower end of left tibia, initial encounter for closed fracture: Secondary | ICD-10-CM | POA: Diagnosis not present

## 2017-07-01 DIAGNOSIS — S82872A Displaced pilon fracture of left tibia, initial encounter for closed fracture: Secondary | ICD-10-CM

## 2017-07-01 DIAGNOSIS — S82842A Displaced bimalleolar fracture of left lower leg, initial encounter for closed fracture: Secondary | ICD-10-CM

## 2017-07-01 DIAGNOSIS — X58XXXA Exposure to other specified factors, initial encounter: Secondary | ICD-10-CM | POA: Diagnosis not present

## 2017-07-01 DIAGNOSIS — S82252A Displaced comminuted fracture of shaft of left tibia, initial encounter for closed fracture: Secondary | ICD-10-CM | POA: Insufficient documentation

## 2017-07-01 DIAGNOSIS — Z9889 Other specified postprocedural states: Secondary | ICD-10-CM

## 2017-07-01 DIAGNOSIS — Z9012 Acquired absence of left breast and nipple: Secondary | ICD-10-CM | POA: Insufficient documentation

## 2017-07-01 DIAGNOSIS — Z853 Personal history of malignant neoplasm of breast: Secondary | ICD-10-CM | POA: Insufficient documentation

## 2017-07-01 DIAGNOSIS — G8918 Other acute postprocedural pain: Secondary | ICD-10-CM | POA: Diagnosis not present

## 2017-07-01 DIAGNOSIS — Z79899 Other long term (current) drug therapy: Secondary | ICD-10-CM | POA: Insufficient documentation

## 2017-07-01 DIAGNOSIS — M25572 Pain in left ankle and joints of left foot: Secondary | ICD-10-CM | POA: Diagnosis not present

## 2017-07-01 DIAGNOSIS — Z419 Encounter for procedure for purposes other than remedying health state, unspecified: Secondary | ICD-10-CM

## 2017-07-01 DIAGNOSIS — S8262XA Displaced fracture of lateral malleolus of left fibula, initial encounter for closed fracture: Secondary | ICD-10-CM | POA: Diagnosis not present

## 2017-07-01 HISTORY — DX: Malignant neoplasm of unspecified site of left female breast: C50.912

## 2017-07-01 HISTORY — PX: FRACTURE SURGERY: SHX138

## 2017-07-01 HISTORY — PX: OPEN REDUCTION INTERNAL FIXATION (ORIF) TIBIA/FIBULA FRACTURE: SHX5992

## 2017-07-01 HISTORY — DX: Other specified postprocedural states: Z98.890

## 2017-07-01 HISTORY — PX: ORIF ANKLE FRACTURE: SHX5408

## 2017-07-01 HISTORY — DX: Other specified postprocedural states: R11.2

## 2017-07-01 LAB — CBC
HEMATOCRIT: 38.2 % (ref 36.0–46.0)
Hemoglobin: 12.5 g/dL (ref 12.0–15.0)
MCH: 30.9 pg (ref 26.0–34.0)
MCHC: 32.7 g/dL (ref 30.0–36.0)
MCV: 94.6 fL (ref 78.0–100.0)
Platelets: 216 10*3/uL (ref 150–400)
RBC: 4.04 MIL/uL (ref 3.87–5.11)
RDW: 13.7 % (ref 11.5–15.5)
WBC: 9.8 10*3/uL (ref 4.0–10.5)

## 2017-07-01 SURGERY — OPEN REDUCTION INTERNAL FIXATION (ORIF) ANKLE FRACTURE
Anesthesia: General | Site: Ankle | Laterality: Left

## 2017-07-01 MED ORDER — METHOCARBAMOL 1000 MG/10ML IJ SOLN
500.0000 mg | Freq: Four times a day (QID) | INTRAVENOUS | Status: DC | PRN
  Filled 2017-07-01: qty 5

## 2017-07-01 MED ORDER — POLYETHYLENE GLYCOL 3350 17 G PO PACK
17.0000 g | PACK | Freq: Every day | ORAL | Status: DC | PRN
  Administered 2017-07-02: 17 g via ORAL
  Filled 2017-07-01: qty 1

## 2017-07-01 MED ORDER — HYDROMORPHONE HCL 1 MG/ML IJ SOLN: 0.2500 mg | INTRAMUSCULAR | Status: DC | PRN

## 2017-07-01 MED ORDER — SORBITOL 70 % SOLN: 30.0000 mL | Freq: Every day | Status: DC | PRN

## 2017-07-01 MED ORDER — CALCIUM CARBONATE 1250 (500 CA) MG PO TABS
1.0000 | ORAL_TABLET | Freq: Two times a day (BID) | ORAL | Status: DC
  Administered 2017-07-02: 500 mg via ORAL
  Filled 2017-07-01: qty 1

## 2017-07-01 MED ORDER — ONDANSETRON HCL 4 MG/2ML IJ SOLN
4.0000 mg | Freq: Four times a day (QID) | INTRAMUSCULAR | Status: DC | PRN
  Administered 2017-07-01: 4 mg via INTRAVENOUS
  Filled 2017-07-01: qty 2

## 2017-07-01 MED ORDER — KRILL OIL 500 MG PO CAPS: 500.0000 mg | ORAL_CAPSULE | Freq: Every day | ORAL | Status: DC

## 2017-07-01 MED ORDER — ZINC SULFATE 220 (50 ZN) MG PO CAPS: 220.0000 mg | ORAL_CAPSULE | Freq: Every day | ORAL | 0 refills | Status: DC

## 2017-07-01 MED ORDER — PROMETHAZINE HCL 25 MG/ML IJ SOLN: 6.2500 mg | INTRAMUSCULAR | Status: DC | PRN

## 2017-07-01 MED ORDER — DIPHENHYDRAMINE HCL 12.5 MG/5ML PO ELIX: 25.0000 mg | ORAL_SOLUTION | ORAL | Status: DC | PRN

## 2017-07-01 MED ORDER — OXYCODONE HCL 5 MG PO TABS
5.0000 mg | ORAL_TABLET | ORAL | Status: DC | PRN
  Administered 2017-07-01 – 2017-07-02 (×3): 5 mg via ORAL
  Filled 2017-07-01 (×3): qty 1

## 2017-07-01 MED ORDER — 0.9 % SODIUM CHLORIDE (POUR BTL) OPTIME
TOPICAL | Status: DC | PRN
Start: 2017-07-01 — End: 2017-07-01
  Administered 2017-07-01: 1000 mL

## 2017-07-01 MED ORDER — CEFAZOLIN SODIUM-DEXTROSE 2-4 GM/100ML-% IV SOLN
INTRAVENOUS | Status: AC
Start: 2017-07-01 — End: 2017-07-01
  Filled 2017-07-01: qty 100

## 2017-07-01 MED ORDER — METOCLOPRAMIDE HCL 5 MG/ML IJ SOLN: 5.0000 mg | Freq: Three times a day (TID) | INTRAMUSCULAR | Status: DC | PRN

## 2017-07-01 MED ORDER — MIDAZOLAM HCL 2 MG/2ML IJ SOLN
2.0000 mg | Freq: Once | INTRAMUSCULAR | Status: AC
  Administered 2017-07-01: 2 mg via INTRAVENOUS

## 2017-07-01 MED ORDER — LACTATED RINGERS IV SOLN
INTRAVENOUS | Status: DC
Start: 2017-07-01 — End: 2017-07-01
  Administered 2017-07-01 (×2): via INTRAVENOUS

## 2017-07-01 MED ORDER — FENTANYL CITRATE (PF) 100 MCG/2ML IJ SOLN: 50.0000 ug | Freq: Once | INTRAMUSCULAR | Status: DC

## 2017-07-01 MED ORDER — MEPERIDINE HCL 25 MG/ML IJ SOLN: 6.2500 mg | INTRAMUSCULAR | Status: DC | PRN

## 2017-07-01 MED ORDER — ONDANSETRON HCL 4 MG/2ML IJ SOLN
INTRAMUSCULAR | Status: DC | PRN
  Administered 2017-07-01: 4 mg via INTRAVENOUS

## 2017-07-01 MED ORDER — OXYCODONE HCL 5 MG PO TABS
10.0000 mg | ORAL_TABLET | ORAL | Status: DC | PRN
Start: 2017-07-01 — End: 2017-07-02
  Administered 2017-07-02 (×2): 10 mg via ORAL
  Filled 2017-07-01 (×2): qty 2

## 2017-07-01 MED ORDER — DEXAMETHASONE SODIUM PHOSPHATE 10 MG/ML IJ SOLN
INTRAMUSCULAR | Status: AC
  Filled 2017-07-01: qty 1

## 2017-07-01 MED ORDER — CALCIUM CARBONATE 1500 (600 CA) MG PO TABS
600.0000 mg | ORAL_TABLET | Freq: Two times a day (BID) | ORAL | Status: DC
  Filled 2017-07-01: qty 1

## 2017-07-01 MED ORDER — SODIUM CHLORIDE 0.9 % IV SOLN
INTRAVENOUS | Status: DC
  Administered 2017-07-01: 19:00:00 via INTRAVENOUS

## 2017-07-01 MED ORDER — ASPIRIN EC 325 MG PO TBEC: 325.0000 mg | DELAYED_RELEASE_TABLET | Freq: Two times a day (BID) | ORAL | 0 refills | Status: DC

## 2017-07-01 MED ORDER — DEXAMETHASONE SODIUM PHOSPHATE 10 MG/ML IJ SOLN
INTRAMUSCULAR | Status: DC | PRN
  Administered 2017-07-01: 8 mg via INTRAVENOUS

## 2017-07-01 MED ORDER — LIDOCAINE 2% (20 MG/ML) 5 ML SYRINGE
INTRAMUSCULAR | Status: AC
  Filled 2017-07-01: qty 5

## 2017-07-01 MED ORDER — OXYCODONE HCL 5 MG PO TABS: 5.0000 mg | ORAL_TABLET | ORAL | 0 refills | Status: DC | PRN

## 2017-07-01 MED ORDER — PROPOFOL 10 MG/ML IV BOLUS
INTRAVENOUS | Status: DC | PRN
  Administered 2017-07-01: 150 mg via INTRAVENOUS

## 2017-07-01 MED ORDER — ACETAMINOPHEN 650 MG RE SUPP: 650.0000 mg | RECTAL | Status: DC | PRN

## 2017-07-01 MED ORDER — CEFAZOLIN SODIUM-DEXTROSE 2-4 GM/100ML-% IV SOLN
2.0000 g | INTRAVENOUS | Status: AC
  Administered 2017-07-01: 2 g via INTRAVENOUS

## 2017-07-01 MED ORDER — KETOROLAC TROMETHAMINE 30 MG/ML IJ SOLN
30.0000 mg | Freq: Once | INTRAMUSCULAR | Status: DC | PRN
  Administered 2017-07-01: 30 mg via INTRAVENOUS

## 2017-07-01 MED ORDER — OXYCODONE HCL ER 10 MG PO T12A
10.0000 mg | EXTENDED_RELEASE_TABLET | Freq: Two times a day (BID) | ORAL | Status: DC
  Administered 2017-07-01 – 2017-07-02 (×2): 10 mg via ORAL
  Filled 2017-07-01 (×2): qty 1

## 2017-07-01 MED ORDER — VITAMIN D3 25 MCG (1000 UNIT) PO TABS
2000.0000 [IU] | ORAL_TABLET | Freq: Every day | ORAL | Status: DC
  Administered 2017-07-02: 2000 [IU] via ORAL
  Filled 2017-07-01 (×2): qty 2

## 2017-07-01 MED ORDER — ONDANSETRON HCL 4 MG/2ML IJ SOLN
INTRAMUSCULAR | Status: AC
  Filled 2017-07-01: qty 2

## 2017-07-01 MED ORDER — FENTANYL CITRATE (PF) 100 MCG/2ML IJ SOLN
INTRAMUSCULAR | Status: DC | PRN
  Administered 2017-07-01 (×3): 50 ug via INTRAVENOUS
  Administered 2017-07-01: 100 ug via INTRAVENOUS

## 2017-07-01 MED ORDER — GLUCOSAMINE-CHONDROITIN 500-400 MG PO TABS: 1.0000 | ORAL_TABLET | Freq: Two times a day (BID) | ORAL | Status: DC

## 2017-07-01 MED ORDER — MAGNESIUM CITRATE PO SOLN: 1.0000 | Freq: Once | ORAL | Status: DC | PRN

## 2017-07-01 MED ORDER — DEXTROSE 5 % IV SOLN
INTRAVENOUS | Status: DC | PRN
  Administered 2017-07-01: 20 ug/min via INTRAVENOUS

## 2017-07-01 MED ORDER — CEFAZOLIN SODIUM-DEXTROSE 2-4 GM/100ML-% IV SOLN
2.0000 g | Freq: Four times a day (QID) | INTRAVENOUS | Status: AC
  Administered 2017-07-02 (×2): 2 g via INTRAVENOUS
  Filled 2017-07-01 (×3): qty 100

## 2017-07-01 MED ORDER — ASPIRIN EC 325 MG PO TBEC
325.0000 mg | DELAYED_RELEASE_TABLET | Freq: Two times a day (BID) | ORAL | Status: DC
  Administered 2017-07-02: 325 mg via ORAL
  Filled 2017-07-01: qty 1

## 2017-07-01 MED ORDER — PROMETHAZINE HCL 25 MG PO TABS: 25.0000 mg | ORAL_TABLET | Freq: Four times a day (QID) | ORAL | 1 refills | Status: DC | PRN

## 2017-07-01 MED ORDER — FENTANYL CITRATE (PF) 100 MCG/2ML IJ SOLN
INTRAMUSCULAR | Status: AC
  Administered 2017-07-01: 50 ug
  Filled 2017-07-01: qty 2

## 2017-07-01 MED ORDER — SUCCINYLCHOLINE CHLORIDE 200 MG/10ML IV SOSY
PREFILLED_SYRINGE | INTRAVENOUS | Status: AC
  Filled 2017-07-01: qty 10

## 2017-07-01 MED ORDER — ROPIVACAINE HCL 5 MG/ML IJ SOLN
INTRAMUSCULAR | Status: DC | PRN
  Administered 2017-07-01 (×12): 5 mL via PERINEURAL

## 2017-07-01 MED ORDER — ACETAMINOPHEN 325 MG PO TABS
650.0000 mg | ORAL_TABLET | ORAL | Status: DC | PRN
  Administered 2017-07-02: 650 mg via ORAL
  Filled 2017-07-01: qty 2

## 2017-07-01 MED ORDER — KETOROLAC TROMETHAMINE 30 MG/ML IJ SOLN
INTRAMUSCULAR | Status: AC
  Filled 2017-07-01: qty 1

## 2017-07-01 MED ORDER — METOCLOPRAMIDE HCL 5 MG PO TABS: 5.0000 mg | ORAL_TABLET | Freq: Three times a day (TID) | ORAL | Status: DC | PRN

## 2017-07-01 MED ORDER — TIZANIDINE HCL 4 MG PO TABS: 4.0000 mg | ORAL_TABLET | Freq: Four times a day (QID) | ORAL | 2 refills | Status: DC | PRN

## 2017-07-01 MED ORDER — MIDAZOLAM HCL 2 MG/2ML IJ SOLN
INTRAMUSCULAR | Status: AC
  Filled 2017-07-01: qty 2

## 2017-07-01 MED ORDER — VITAMIN C 500 MG PO TABS: 250.0000 mg | ORAL_TABLET | Freq: Every day | ORAL | Status: DC | PRN

## 2017-07-01 MED ORDER — ROCURONIUM BROMIDE 100 MG/10ML IV SOLN
INTRAVENOUS | Status: DC | PRN
  Administered 2017-07-01: 50 mg via INTRAVENOUS

## 2017-07-01 MED ORDER — ONDANSETRON HCL 4 MG PO TABS: 4.0000 mg | ORAL_TABLET | Freq: Three times a day (TID) | ORAL | 0 refills | Status: DC | PRN

## 2017-07-01 MED ORDER — KETOROLAC TROMETHAMINE 15 MG/ML IJ SOLN
30.0000 mg | Freq: Four times a day (QID) | INTRAMUSCULAR | Status: AC
  Administered 2017-07-01 – 2017-07-02 (×4): 30 mg via INTRAVENOUS
  Filled 2017-07-01 (×4): qty 2

## 2017-07-01 MED ORDER — MORPHINE SULFATE (PF) 4 MG/ML IV SOLN
1.0000 mg | INTRAVENOUS | Status: DC | PRN
Start: 2017-07-01 — End: 2017-07-02
  Administered 2017-07-02: 1 mg via INTRAVENOUS
  Filled 2017-07-01: qty 1

## 2017-07-01 MED ORDER — ONDANSETRON HCL 4 MG PO TABS: 4.0000 mg | ORAL_TABLET | Freq: Four times a day (QID) | ORAL | Status: DC | PRN

## 2017-07-01 MED ORDER — SUGAMMADEX SODIUM 200 MG/2ML IV SOLN
INTRAVENOUS | Status: DC | PRN
  Administered 2017-07-01: 160 mg via INTRAVENOUS

## 2017-07-01 MED ORDER — CALCIUM CARBONATE 1500 (600 CA) MG PO TABS
600.0000 mg | ORAL_TABLET | Freq: Two times a day (BID) | ORAL | Status: DC
  Filled 2017-07-01 (×2): qty 1

## 2017-07-01 MED ORDER — OXYCODONE HCL ER 10 MG PO T12A: 10.0000 mg | EXTENDED_RELEASE_TABLET | Freq: Two times a day (BID) | ORAL | 0 refills | Status: DC

## 2017-07-01 MED ORDER — ADULT MULTIVITAMIN W/MINERALS CH
1.0000 | ORAL_TABLET | Freq: Every day | ORAL | Status: DC
  Administered 2017-07-01 – 2017-07-02 (×2): 1 via ORAL
  Filled 2017-07-01 (×3): qty 1

## 2017-07-01 MED ORDER — FENTANYL CITRATE (PF) 250 MCG/5ML IJ SOLN
INTRAMUSCULAR | Status: AC
  Filled 2017-07-01: qty 5

## 2017-07-01 MED ORDER — ROCURONIUM BROMIDE 10 MG/ML (PF) SYRINGE
PREFILLED_SYRINGE | INTRAVENOUS | Status: AC
  Filled 2017-07-01: qty 5

## 2017-07-01 MED ORDER — LIDOCAINE HCL (CARDIAC) 20 MG/ML IV SOLN
INTRAVENOUS | Status: DC | PRN
  Administered 2017-07-01: 100 mg via INTRAVENOUS

## 2017-07-01 MED ORDER — SENNOSIDES-DOCUSATE SODIUM 8.6-50 MG PO TABS: 1.0000 | ORAL_TABLET | Freq: Every evening | ORAL | 1 refills | Status: DC | PRN

## 2017-07-01 MED ORDER — FENTANYL CITRATE (PF) 100 MCG/2ML IJ SOLN
50.0000 ug | Freq: Once | INTRAMUSCULAR | Status: AC
  Administered 2017-07-01: 50 ug via INTRAVENOUS

## 2017-07-01 MED ORDER — METHOCARBAMOL 500 MG PO TABS: 500.0000 mg | ORAL_TABLET | Freq: Four times a day (QID) | ORAL | Status: DC | PRN

## 2017-07-01 SURGICAL SUPPLY — 83 items
BANDAGE ACE 4X5 VEL STRL LF (GAUZE/BANDAGES/DRESSINGS) ×2 IMPLANT
BANDAGE ACE 6X5 VEL STRL LF (GAUZE/BANDAGES/DRESSINGS) ×2 IMPLANT
BANDAGE ELASTIC 6 VELCRO ST LF (GAUZE/BANDAGES/DRESSINGS) ×2 IMPLANT
BANDAGE ESMARK 6X9 LF (GAUZE/BANDAGES/DRESSINGS) IMPLANT
BIT DRILL 2.5 (BIT) ×1
BIT DRILL 3.1 (BIT) ×2 IMPLANT
BIT DRILL SHRT 216X2.5XNS LF (BIT) ×1 IMPLANT
BIT DRL SHRT 216X2.5XNS LF (BIT) ×1
BLADE SURG 15 STRL LF DISP TIS (BLADE) ×1 IMPLANT
BLADE SURG 15 STRL SS (BLADE) ×1
BNDG CMPR 9X6 STRL LF SNTH (GAUZE/BANDAGES/DRESSINGS)
BNDG COHESIVE 4X5 TAN STRL (GAUZE/BANDAGES/DRESSINGS) ×2 IMPLANT
BNDG COHESIVE 6X5 TAN STRL LF (GAUZE/BANDAGES/DRESSINGS) ×2 IMPLANT
BNDG ESMARK 6X9 LF (GAUZE/BANDAGES/DRESSINGS)
BNDG GAUZE ELAST 4 BULKY (GAUZE/BANDAGES/DRESSINGS) ×4 IMPLANT
CANISTER SUCT 3000ML PPV (MISCELLANEOUS) ×2 IMPLANT
COVER SURGICAL LIGHT HANDLE (MISCELLANEOUS) ×2 IMPLANT
CUFF TOURNIQUET SINGLE 34IN LL (TOURNIQUET CUFF) ×2 IMPLANT
CUFF TOURNIQUET SINGLE 44IN (TOURNIQUET CUFF) IMPLANT
DRAPE C-ARM 42X72 X-RAY (DRAPES) ×2 IMPLANT
DRAPE C-ARMOR (DRAPES) ×2 IMPLANT
DRAPE INCISE IOBAN 66X45 STRL (DRAPES) IMPLANT
DRAPE U-SHAPE 47X51 STRL (DRAPES) ×2 IMPLANT
DRILL 2.6X122MM WL AO SHAFT (BIT) ×2 IMPLANT
DURAPREP 26ML APPLICATOR (WOUND CARE) ×2 IMPLANT
ELECT CAUTERY BLADE 6.4 (BLADE) ×2 IMPLANT
ELECT REM PT RETURN 9FT ADLT (ELECTROSURGICAL) ×2
ELECTRODE REM PT RTRN 9FT ADLT (ELECTROSURGICAL) ×1 IMPLANT
FIXATOR TEMP PLATE AO FITTING (MISCELLANEOUS) ×1 IMPLANT
GAUZE SPONGE 4X4 12PLY STRL (GAUZE/BANDAGES/DRESSINGS) ×2 IMPLANT
GAUZE XEROFORM 5X9 LF (GAUZE/BANDAGES/DRESSINGS) ×2 IMPLANT
GLOVE SKINSENSE NS SZ7.5 (GLOVE) ×2
GLOVE SKINSENSE STRL SZ7.5 (GLOVE) ×2 IMPLANT
GLOVE SURG SYN 7.5  E (GLOVE) ×2
GLOVE SURG SYN 7.5 E (GLOVE) ×2 IMPLANT
GOWN STRL REIN XL XLG (GOWN DISPOSABLE) ×2 IMPLANT
HANDPIECE INTERPULSE COAX TIP (DISPOSABLE)
K-WIRE 2.0 (WIRE) ×3
K-WIRE FX234X2XDRL TIP NS (WIRE) ×3
K-WIRE ORTHOPEDIC 1.4X150L (WIRE) ×6
KIT BASIN OR (CUSTOM PROCEDURE TRAY) ×2 IMPLANT
KIT ROOM TURNOVER OR (KITS) ×2 IMPLANT
KWIRE FX234X2XDRL TIP NS (WIRE) ×3 IMPLANT
KWIRE ORTHOPEDIC 1.4X150L (WIRE) ×3 IMPLANT
NEEDLE 22X1 1/2 (OR ONLY) (NEEDLE) IMPLANT
NEEDLE HYPO 25GX1X1/2 BEV (NEEDLE) IMPLANT
NS IRRIG 1000ML POUR BTL (IV SOLUTION) ×2 IMPLANT
PACK ORTHO EXTREMITY (CUSTOM PROCEDURE TRAY) ×2 IMPLANT
PAD ABD 8X10 STRL (GAUZE/BANDAGES/DRESSINGS) ×4 IMPLANT
PAD ARMBOARD 7.5X6 YLW CONV (MISCELLANEOUS) ×4 IMPLANT
PAD CAST 4YDX4 CTTN HI CHSV (CAST SUPPLIES) ×2 IMPLANT
PADDING CAST COTTON 4X4 STRL (CAST SUPPLIES) ×2
PADDING CAST COTTON 6X4 STRL (CAST SUPPLIES) ×4 IMPLANT
PLATE DISTAL FIBULA 6HOLD ANKL (Plate) ×2 IMPLANT
PLATE DSTAL MEDIAL TIBIA 14H L (Plate) ×2 IMPLANT
SCREW BONE 14MMX3.5MM (Screw) ×2 IMPLANT
SCREW BONE 18 (Screw) ×4 IMPLANT
SCREW BONE 36X3.5MM (Screw) ×4 IMPLANT
SCREW BONE CORT 3.5X24MM (Screw) ×6 IMPLANT
SCREW BONE NON-LCKING 3.5X12MM (Screw) ×4 IMPLANT
SCREW CANCELLOUS 4.0X50MM FT (Screw) ×2 IMPLANT
SCREW CORT 32X3.5XST LCK NS (Screw) ×1 IMPLANT
SCREW CORTICAL 3.5X32MM (Screw) ×1 IMPLANT
SCREW LOCKING 3.5X18MM (Screw) ×2 IMPLANT
SCREW NONLOCK 22MM (Screw) ×2 IMPLANT
SET HNDPC FAN SPRY TIP SCT (DISPOSABLE) IMPLANT
SPLINT FIBERGLASS 4X30 (CAST SUPPLIES) ×4 IMPLANT
SPONGE LAP 18X18 X RAY DECT (DISPOSABLE) IMPLANT
STAPLER VISISTAT 35W (STAPLE) IMPLANT
STOCKINETTE IMPERVIOUS LG (DRAPES) IMPLANT
SUCTION FRAZIER HANDLE 10FR (MISCELLANEOUS) ×1
SUCTION TUBE FRAZIER 10FR DISP (MISCELLANEOUS) ×1 IMPLANT
SUT ETHILON 3 0 PS 1 (SUTURE) ×8 IMPLANT
SUT VIC AB 2-0 CT1 27 (SUTURE) ×6
SUT VIC AB 2-0 CT1 TAPERPNT 27 (SUTURE) ×3 IMPLANT
SYR CONTROL 10ML LL (SYRINGE) IMPLANT
TEMP PLATE FIXATOR AO FITTING (MISCELLANEOUS) ×2
TOWEL OR 17X24 6PK STRL BLUE (TOWEL DISPOSABLE) ×4 IMPLANT
TOWEL OR 17X26 10 PK STRL BLUE (TOWEL DISPOSABLE) ×4 IMPLANT
TUBE CONNECTING 12X1/4 (SUCTIONS) ×4 IMPLANT
UNDERPAD 30X30 (UNDERPADS AND DIAPERS) ×2 IMPLANT
WATER STERILE IRR 1000ML POUR (IV SOLUTION) IMPLANT
YANKAUER SUCT BULB TIP NO VENT (SUCTIONS) ×2 IMPLANT

## 2017-07-01 NOTE — Discharge Instructions (Signed)
° ° °  1. Keep splint clean and dry °2. Elevate foot above level of the heart °3. Take aspirin to prevent blood clots °4. Take pain meds as needed °5. Strict non weight bearing to operative extremity ° °
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
## 2017-07-01 NOTE — Anesthesia Preprocedure Evaluation (Signed)
Anesthesia Evaluation  Patient identified by MRN, date of birth, ID band Patient awake    Reviewed: Allergy & Precautions, H&P , NPO status , Patient's Chart, lab work & pertinent test results  History of Anesthesia Complications (+) PONV and history of anesthetic complications  Airway Mallampati: I  TM Distance: >3 FB Neck ROM: full    Dental no notable dental hx. (+) Teeth Intact   Pulmonary neg pulmonary ROS,    Pulmonary exam normal breath sounds clear to auscultation       Cardiovascular negative cardio ROS Normal cardiovascular exam Rhythm:regular Rate:Normal     Neuro/Psych negative psych ROS   GI/Hepatic negative GI ROS, Neg liver ROS,   Endo/Other  negative endocrine ROS  Renal/GU negative Renal ROS     Musculoskeletal   Abdominal Normal abdominal exam  (+)   Peds  Hematology negative hematology ROS (+)   Anesthesia Other Findings   Reproductive/Obstetrics negative OB ROS                             Anesthesia Physical Anesthesia Plan  ASA: II  Anesthesia Plan: General   Post-op Pain Management:  Regional for Post-op pain   Induction:   PONV Risk Score and Plan: 4 or greater and Ondansetron, Dexamethasone, Midazolam and Scopolamine patch - Pre-op  Airway Management Planned: LMA  Additional Equipment:   Intra-op Plan:   Post-operative Plan:   Informed Consent: I have reviewed the patients History and Physical, chart, labs and discussed the procedure including the risks, benefits and alternatives for the proposed anesthesia with the patient or authorized representative who has indicated his/her understanding and acceptance.   Dental Advisory Given  Plan Discussed with: CRNA and Surgeon  Anesthesia Plan Comments:         Anesthesia Quick Evaluation

## 2017-07-01 NOTE — Anesthesia Procedure Notes (Signed)
Procedure Name: Intubation Date/Time: 07/01/2017 12:47 PM Performed by: Izora Gala Pre-anesthesia Checklist: Patient identified, Emergency Drugs available, Suction available and Patient being monitored Patient Re-evaluated:Patient Re-evaluated prior to induction Oxygen Delivery Method: Circle system utilized Preoxygenation: Pre-oxygenation with 100% oxygen Induction Type: IV induction Ventilation: Mask ventilation without difficulty Laryngoscope Size: Miller and 3 Grade View: Grade I Tube type: Oral Tube size: 7.0 mm Number of attempts: 1 Airway Equipment and Method: Stylet Placement Confirmation: ETT inserted through vocal cords under direct vision,  positive ETCO2 and breath sounds checked- equal and bilateral Secured at: 22 cm Tube secured with: Tape Dental Injury: Teeth and Oropharynx as per pre-operative assessment

## 2017-07-01 NOTE — Anesthesia Procedure Notes (Addendum)
Anesthesia Regional Block: Popliteal block   Pre-Anesthetic Checklist: ,, timeout performed, Correct Patient, Correct Site, Correct Laterality, Correct Procedure, Correct Position, site marked, Risks and benefits discussed,  Surgical consent,  Pre-op evaluation,  At surgeon's request and post-op pain management  Laterality: Left and Lower  Prep: chloraprep       Needles:  Injection technique: Single-shot     Needle Length: 9cm  Needle Gauge: 21   Needle insertion depth: 3 cm   Additional Needles:   Procedures:,,,, ultrasound used (permanent image in chart),,,,  Narrative:  Start time: 07/01/2017 12:25 PM End time: 07/01/2017 12:35 PM Injection made incrementally with aspirations every 5 mL.  Performed by: Personally  Anesthesiologist: Lyn Hollingshead

## 2017-07-01 NOTE — Evaluation (Signed)
Physical Therapy Evaluation Patient Details Name: DAILYNN Gray MRN: [id number] DOB: 1951/07/19 Today's Date: 07/01/2017   History of Present Illness  Pt is a 66 y.o. female who presents for surgical treatment of left tibia/fibula fracture; now s/p L tibia/fibula ORIF and L ankle ORIF (NWB). Pertinent PMH includes arthritis, breast CA.     Clinical Impression  Pt presents with pain and an overall decrease in functional mobility secondary to above. PTA, pt indep with mobility; since this recent fx, she has been relying on crutches and rollator (being pushed by husband) for mobility. Husband is available for 24/7 assist if needed; has multiple DME available from church members (including RW, crutches, knee scooter). Educ on precautions, positioning, and importance of mobility. Today, pt mod indep for bed mobility; required intermittent min guard to transfer and amb with RW. Good technique maintaining LLE NWB precautions. Do not feel pt needs HHPT, but discussed potential for outpatient orthopedic PT when she is able to WB through ankle. Pt would benefit from continued acute PT services to maximize functional mobility and independence prior to d/c home.     Follow Up Recommendations No PT follow up;Supervision for mobility/OOB    Equipment Recommendations  None recommended by PT    Recommendations for Other Services OT consult     Precautions / Restrictions Precautions Precautions: Fall Restrictions Weight Bearing Restrictions: Yes LLE Weight Bearing: Non weight bearing      Mobility  Bed Mobility Overal bed mobility: Modified Independent                Transfers Overall transfer level: Needs assistance Equipment used: Rolling walker (2 wheeled) Transfers: Sit to/from Stand Sit to Stand: Min guard         General transfer comment: Min guard for first time up; good technique with RW. No physical assist required  Ambulation/Gait Ambulation/Gait assistance: Min  guard Ambulation Distance (Feet): 80 Feet Assistive device: Rolling walker (2 wheeled)   Gait velocity: Decreased Gait velocity interpretation: <1.8 ft/sec, indicative of risk for recurrent falls General Gait Details: Amb with RW and intermittent min guard for balance; good technique with hop-to pattern to maintain LLE NWB precautions. Pt declining further mobiltiy secondary to wanting to visit with friends/family who had arrived  Stairs            Wheelchair Mobility    Modified Rankin (Stroke Patients Only)       Balance Overall balance assessment: Needs assistance Sitting-balance support: No upper extremity supported;Feet supported;Feet unsupported Sitting balance-Leahy Scale: Good     Standing balance support: No upper extremity supported;Bilateral upper extremity supported Standing balance-Leahy Scale: Fair Standing balance comment: Able to static stand with no UE support                             Pertinent Vitals/Pain Pain Assessment: Faces Faces Pain Scale: Hurts a little bit Pain Location: LLE Pain Descriptors / Indicators: Discomfort Pain Intervention(s): Limited activity within patient's tolerance;Monitored during session;Repositioned    Home Living Family/patient expects to be discharged to:: Private residence Living Arrangements: Spouse/significant other Available Help at Discharge: Family;Available 24 hours/day Type of Home: House Home Access: Stairs to enter Entrance Stairs-Rails: Right Entrance Stairs-Number of Steps: 6 Home Layout: Multi-level;Able to live on main level with bedroom/bathroom Home Equipment: Walker - 4 wheels;Walker - standard;Crutches;Other (comment) (knee scooter) Additional Comments: DME borrowed from church members    Prior Function Level of Independence: Independent  Hand Dominance        Extremity/Trunk Assessment   Upper Extremity Assessment Upper Extremity Assessment: Overall WFL for  tasks assessed    Lower Extremity Assessment Lower Extremity Assessment: LLE deficits/detail LLE Deficits / Details: s/p tib/fib & ankle ORIF; hip flex 4/5, knee flex/ext grossly 3/5 LLE: Unable to fully assess due to pain;Unable to fully assess due to immobilization       Communication   Communication: No difficulties  Cognition Arousal/Alertness: Awake/alert Behavior During Therapy: WFL for tasks assessed/performed Overall Cognitive Status: Within Functional Limits for tasks assessed                                        General Comments General comments (skin integrity, edema, etc.): Husband present throughout session    Exercises     Assessment/Plan    PT Assessment Patient needs continued PT services  PT Problem List Decreased strength;Decreased balance;Decreased mobility;Decreased knowledge of use of DME;Decreased knowledge of precautions;Pain       PT Treatment Interventions DME instruction;Gait training;Stair training;Functional mobility training;Therapeutic activities;Therapeutic exercise;Balance training;Patient/family education    PT Goals (Current goals can be found in the Care Plan section)  Acute Rehab PT Goals Patient Stated Goal: Return home PT Goal Formulation: With patient Time For Goal Achievement: 07/15/17 Potential to Achieve Goals: Good    Frequency Min 5X/week   Barriers to discharge        Co-evaluation               AM-PAC PT "6 Clicks" Daily Activity  Outcome Measure Difficulty turning over in bed (including adjusting bedclothes, sheets and blankets)?: None Difficulty moving from lying on back to sitting on the side of the bed? : None Difficulty sitting down on and standing up from a chair with arms (e.g., wheelchair, bedside commode, etc,.)?: A Little Help needed moving to and from a bed to chair (including a wheelchair)?: A Little Help needed walking in hospital room?: A Little Help needed climbing 3-5 steps  with a railing? : A Little 6 Click Score: 20    End of Session Equipment Utilized During Treatment: Gait belt Activity Tolerance: Patient tolerated treatment well Patient left: in chair;with call bell/phone within reach;with family/visitor present Nurse Communication: Mobility status PT Visit Diagnosis: Other abnormalities of gait and mobility (R26.89)    Time: 9470-9628 PT Time Calculation (min) (ACUTE ONLY): 27 min   Charges:   PT Evaluation $PT Eval Low Complexity: 1 Low PT Treatments $Gait Training: 8-22 mins   PT G Codes:       Mabeline Caras, PT, DPT Acute Rehab Services  Pager: [place] 07/01/2017, 5:25 PM

## 2017-07-01 NOTE — Anesthesia Procedure Notes (Addendum)
Anesthesia Regional Block: Adductor canal block   Pre-Anesthetic Checklist: ,, timeout performed, Correct Patient, Correct Site, Correct Laterality, Correct Procedure, Correct Position, site marked, Risks and benefits discussed,  Surgical consent,  Pre-op evaluation,  At surgeon's request and post-op pain management  Laterality: Left and Lower  Prep: chloraprep       Needles:  Injection technique: Single-shot  Needle Type: Echogenic Stimulator Needle     Needle Length: 9cm  Needle Gauge: 21     Additional Needles:   Procedures:,,,, ultrasound used (permanent image in chart),,,,  Narrative:  Start time: 07/01/2017 12:10 PM End time: 07/01/2017 12:18 PM Injection made incrementally with aspirations every 5 mL.  Performed by: Personally  Anesthesiologist: Lyn Hollingshead

## 2017-07-01 NOTE — Transfer of Care (Signed)
Immediate Anesthesia Transfer of Care Note  Patient: [name]  Procedure(s) Performed: OPEN REDUCTION INTERNAL FIXATION (ORIF) LEFT TIBIA/PILON FRACTURE/FIBULA FRACTURE (Left Ankle)  Patient Location: PACU  Anesthesia Type:General  Level of Consciousness: awake, alert  and oriented  Airway & Oxygen Therapy: Patient Spontanous Breathing and Patient connected to nasal cannula oxygen  Post-op Assessment: Report given to RN and Post -op Vital signs reviewed and stable  Post vital signs: Reviewed and stable  Last Vitals:  Vitals:   07/01/17 1529 07/01/17 1530  BP:  (!) 147/80  Pulse:  87  Resp:  12  Temp: 36.8 C   SpO2:  100%    Last Pain:  Vitals:   07/01/17 1529  TempSrc:   PainSc: 0-No pain      Patients Stated Pain Goal: 4 (01/77/93 9030)  Complications: No apparent anesthesia complications

## 2017-07-01 NOTE — H&P (Signed)
    PREOPERATIVE H&P  Chief Complaint: left tibia/fibula fracture  HPI: [name] is a 66 y.o. female who presents for surgical treatment of left tibia/fibula fracture.  She denies any changes in medical history.  Past Medical History:  Diagnosis Date  . Arthritis   . Breast cancer ([place]) 08/2003   S/p left breast mastectomy  . Carpal tunnel syndrome, bilateral   . PONV (postoperative nausea and vomiting)    Past Surgical History:  Procedure Laterality Date  . Carpal tunnel surgery Bilateral   . MASTECTOMY Left 10/14/2003  . MASTECTOMY WITH AXILLARY LYMPH NODE DISSECTION Left 10/14/2003   Social History   Social History  . Marital status: Married    Spouse name: N/A  . Number of children: N/A  . Years of education: N/A   Social History Main Topics  . Smoking status: Never Smoker  . Smokeless tobacco: Never Used  . Alcohol use 0.0 oz/week    5 - 7 Glasses of wine per week  . Drug use: No  . Sexual activity: Yes    Birth control/ protection: Post-menopausal   Other Topics Concern  . None   Social History Narrative  . None   Family History  Problem Relation Age of Onset  . Alzheimer's disease Mother   . Seizures Mother    No Known Allergies Prior to Admission medications   Medication Sig Start Date End Date Taking? Authorizing Provider  calcium carbonate (OS-CAL) 600 MG TABS Take 600 mg by mouth 2 (two) times daily with a meal.   Yes [provider]  Cholecalciferol (VITAMIN D) 2000 UNITS tablet Take 2,000 Units by mouth daily.   Yes [provider]  glucosamine-chondroitin 500-400 MG tablet Take 1 tablet by mouth 2 (two) times daily.    Yes [provider]  HYDROcodone-acetaminophen (NORCO/VICODIN) 5-325 MG tablet Take 1 tablet by mouth every 6 (six) hours as needed for pain. 06/29/17 07/04/17 Yes [provider]  Javier Docker Oil 500 MG CAPS Take 500 mg by mouth daily.    Yes [provider]  Multiple Vitamins-Minerals  (MULTIVITAMIN WITH MINERALS) tablet Take 1 tablet by mouth daily.   Yes [provider]  vitamin C (ASCORBIC ACID) 250 MG tablet Take 250 mg by mouth daily as needed (cold symptoms).    Yes [provider]  ECHINACEA PO Take 2 tablets by mouth as needed.    [provider]     Positive ROS: All other systems have been reviewed and were otherwise negative with the exception of those mentioned in the HPI and as above.  Physical Exam: General: Alert, no acute distress Cardiovascular: No pedal edema Respiratory: No cyanosis, no use of accessory musculature GI: abdomen soft Skin: No lesions in the area of chief complaint Neurologic: Sensation intact distally Psychiatric: Patient is competent for consent with normal mood and affect Lymphatic: no lymphedema  MUSCULOSKELETAL: exam stable  Assessment: left tibia/fibula fracture  Plan: Plan for Procedure(s): OPEN REDUCTION INTERNAL FIXATION (ORIF) LEFT TIBIA/PILON FRACTURE VS. EXTERNAL FIXATION EXTERNAL FIXATION LEG  The risks benefits and alternatives were discussed with the patient including but not limited to the risks of nonoperative treatment, versus surgical intervention including infection, bleeding, nerve injury,  blood clots, cardiopulmonary complications, morbidity, mortality, among others, and they were willing to proceed.   Eduard Roux, MD   07/01/2017 11:28 AM

## 2017-07-01 NOTE — Op Note (Signed)
Date of Surgery: 07/01/2017  INDICATIONS: Ms. Posten is a 66 y.o.-year-old female with a left distal tibia fracture with bimalleolar ankle fracture;  The patient did consent to the procedure after discussion of the risks and benefits.  PREOPERATIVE DIAGNOSIS:  1. Left tibial shaft fracture 2. Left bimalleolar ankle fracture  POSTOPERATIVE DIAGNOSIS: Same.  PROCEDURE:  1.  Open reduction internal fixation of left tibia fracture with plate fixation 2.  Open reduction internal fixation of left bimalleolar ankle fracture  SURGEON: N. Eduard Roux, M.D.  ASSIST: Ky Barban, RNFA.  ANESTHESIA:  general, regional  IV FLUIDS AND URINE: See anesthesia.  ESTIMATED BLOOD LOSS: Minimal mL.  IMPLANTS: Stryker Variax 6 hole distal fibula plate; Stryker Axsos 3 14 hole distal medial tibial plate  DRAINS: None  COMPLICATIONS: None.  DESCRIPTION OF PROCEDURE: The patient was brought to the operating room and placed supine on the operating table.  The patient had been signed prior to the procedure and this was documented. The patient had the anesthesia placed by the anesthesiologist.  A time-out was performed to confirm that this was the correct patient, site, side and location. The patient did receive antibiotics prior to the incision and was re-dosed during the procedure as needed at indicated intervals.  A tourniquet was placed.  The patient had the operative extremity prepped and draped in the standard surgical fashion.    I first made a lateral incision over the distal fibula.  Dissection was carried down to the fascia.  Subperiosteal elevation was then performed.  The fracture has significant comminution that was not amenable to lag fixation.  The overall length and alignment of the fibula was achieved and a precontoured distal fibula plate was placed using fluoroscopic guidance.  We first placed 3 nonlocking screws in the distal cluster of the precontoured plate each with excellent fixation.   Care was taken not to penetrate the far cortex.  We then obtained a reduction and alignment of the fracture and secured the proximal portion of the plate to the fibular shaft.  3 nonlocking screws were then placed through the plate into the fibula each with excellent purchase.  X-rays were taken to confirm appropriate placement of the hardware.  We then made a separate medial incision to address the medial malleolus and tibial shaft fracture.  Dissection was then carried through the subcutaneous tissue.  Saphenous neurovascular bundle was identified and mobilized and protected.  Subperiosteal elevation was performed.  A Cobb elevator was then used to create a submuscular pocket for the plate.  The length of the plate was determined using fluoroscopy.  This plate was then slid up the medial aspect of the distal tibia and tibial shaft to its proper position.  K wires were then used to provisionally secure the plate to the tibia.  I then used a whirlybird in order to contour the distal fracture fragment to the distal portion of the plate.  With the overall fracture aligned and out to length I then placed a nonlocking screw at the apex of the medial malleolus fracture parallel to the ankle joint.  This screw had excellent purchase.  I then placed a second nonlocking screw just proximal to the apex screw also with excellent purchase.  After this was done I then made a counterincision over the proximal portion of the plate.  Blunt dissection was performed to expose the plate.  2 nonlocking screws were then placed bicortically through the proximal portion of the plate each with excellent purchase.  I then turned my attention distally to fixation of the medial malleolus fracture.  I put a single 4.0 mm cannulated screw through the distal hole of the precontoured plate up the medial malleolus and across the fracture to capture the medial malleolus fracture.  I then placed 1 additional nonlocking screw unicortically through  the distal portion of the plate.  I then finally made  another counterincision over the proximal portion of the leg to place 1 more nonlocking screw just proximal to the main fracture line.  The screw had excellent purchase.  Final x-rays were taken.  The syndesmosis was stable when stressed.  The wounds were then thoroughly irrigated with 4 L of normal saline.  Surgical wounds were closed in layer fashion using 2-0 Vicryl, 3-0 nylon.  Sterile dressings were applied.  Foot was immobilized in a short leg splint.  Tourniquet was deflated.  Patient tolerated procedure well had no immediate complications.  POSTOPERATIVE PLAN: Patient will be admitted overnight for observation pain control and evaluation by physical therapy in the morning.  She will be nonweightbearing for 6 weeks.  She will be placed on aspirin for DVT prophylaxis.  Azucena Cecil, MD [place] 3:17 PM

## 2017-07-02 DIAGNOSIS — S82302A Unspecified fracture of lower end of left tibia, initial encounter for closed fracture: Secondary | ICD-10-CM | POA: Diagnosis not present

## 2017-07-02 LAB — BASIC METABOLIC PANEL
Anion gap: 7 (ref 5–15)
BUN: 9 mg/dL (ref 6–20)
CALCIUM: 8.4 mg/dL — AB (ref 8.9–10.3)
CHLORIDE: 105 mmol/L (ref 101–111)
CO2: 26 mmol/L (ref 22–32)
CREATININE: 0.67 mg/dL (ref 0.44–1.00)
Glucose, Bld: 101 mg/dL — ABNORMAL HIGH (ref 65–99)
Potassium: 3.8 mmol/L (ref 3.5–5.1)
SODIUM: 138 mmol/L (ref 135–145)

## 2017-07-02 NOTE — Progress Notes (Signed)
Patient is doing well this morning and her pain is well controlled.  Her splint was irritating her popliteal fossa.  The splint was removed and a new splint was applied.  Her foot is warm well perfused neurovascular intact with good cap refill.  She performed well with physical therapy.  She is stable for discharge home from orthopedic standpoint.  Follow-up in 2 weeks.

## 2017-07-02 NOTE — Evaluation (Signed)
Occupational Therapy Evaluation Patient Details Name: [name] MRN: [id number] DOB: 04-14-1951 Today's Date: 07/02/2017    History of Present Illness Pt is a 66 y.o. female who presents for surgical treatment of left tibia/fibula fracture; now s/p L tibia/fibula ORIF and L ankle ORIF (NWB). Pertinent PMH includes arthritis, breast CA.    Clinical Impression   PTA, pt was living with husband and was independent. Currently, pt requires Min Guard A for ADLs in standing, LB ADLs, and functional mobility using RW. Pt requiring Min A for shower transfer. Provided education on LB ADLs, toilet transfer, and shower transfer with seat; pt demonstrated understanding. Answered all pt questions. Recommend dc home once medically stable per physician. All acute OT needs met and will sign off. Thank you.     Follow Up Recommendations  No OT follow up;Supervision - Intermittent    Equipment Recommendations  None recommended by OT;Other (comment) (Pt reporting DME available through church)    Recommendations for Other Services PT consult     Precautions / Restrictions Precautions Precautions: Fall Restrictions Weight Bearing Restrictions: Yes LLE Weight Bearing: Non weight bearing      Mobility Bed Mobility Overal bed mobility: Modified Independent             General bed mobility comments: Requires increased time  Transfers Overall transfer level: Needs assistance Equipment used: Rolling walker (2 wheeled) Transfers: Sit to/from Stand Sit to Stand: Min guard         General transfer comment: Min guard for safety. VCs for hand placement on descent    Balance Overall balance assessment: Needs assistance Sitting-balance support: No upper extremity supported;Feet supported;Feet unsupported Sitting balance-Leahy Scale: Good     Standing balance support: No upper extremity supported;Bilateral upper extremity supported Standing balance-Leahy Scale: Fair Standing balance  comment: Able to static stand with no UE support                           ADL either performed or assessed with clinical judgement   ADL Overall ADL's : Needs assistance/impaired Eating/Feeding: Set up;Sitting   Grooming: Min guard;Standing;Oral care;Wash/dry face;Wash/dry hands   Upper Body Bathing: Set up;Sitting   Lower Body Bathing: Min guard;Sit to/from stand;Cueing for compensatory techniques Lower Body Bathing Details (indicate cue type and reason): Educated on LB bathing techniques and safety Upper Body Dressing : Set up;Sitting Upper Body Dressing Details (indicate cue type and reason): donned shirt with set up Lower Body Dressing: Min guard;Cueing for sequencing;Sit to/from stand;Cueing for compensatory techniques Lower Body Dressing Details (indicate cue type and reason): Educated pt on LB dressing and compensatory techniques. Pt donned underwear and pants with Min Guard for safety in standing Toilet Transfer: Min guard;Ambulation;Regular Toilet;RW Toilet Transfer Details (indicate cue type and reason): Min Guard for safety. Pt with tendency to descend quickly without reaching back. Provided education on safety transfer techniques. Toileting- Clothing Manipulation and Hygiene: Set up;Sitting/lateral lean;Supervision/safety   Tub/ Shower Transfer: Walk-in shower;Minimal assistance;Ambulation;Shower seat;Rolling walker;Cueing for Higher education careers adviser Details (indicate cue type and reason): Pt performed shower transfer with Min A for safety and stability. Educated on safe transfer techniques and adherance to NBW status Functional mobility during ADLs: Min guard;Rolling walker General ADL Comments: Pt performing ADLs with Min Guard - Min A and demonstratign adherance to WB status. Educated pt on compensatory techniques for LB ADLs, toileting and shower transfer. Feel pt will progress well with time.     Vision  Perception     Praxis       Pertinent Vitals/Pain Pain Assessment: 0-10 Pain Score: 5  Pain Location: LLE Pain Descriptors / Indicators: Discomfort Pain Intervention(s): Monitored during session     Hand Dominance Right   Extremity/Trunk Assessment Upper Extremity Assessment Upper Extremity Assessment: Overall WFL for tasks assessed   Lower Extremity Assessment Lower Extremity Assessment: Defer to PT evaluation   Cervical / Trunk Assessment Cervical / Trunk Assessment: Normal   Communication Communication Communication: No difficulties   Cognition Arousal/Alertness: Awake/alert Behavior During Therapy: WFL for tasks assessed/performed Overall Cognitive Status: Within Functional Limits for tasks assessed                                     General Comments  Edema in LLE with new ace wrapping    Exercises     Shoulder Instructions      Home Living Family/patient expects to be discharged to:: Private residence Living Arrangements: Spouse/significant other Available Help at Discharge: Family;Available 24 hours/day Type of Home: House Home Access: Stairs to enter [employer] of Steps: 6 Entrance Stairs-Rails: Right Home Layout: Multi-level;Able to live on main level with bedroom/bathroom     Bathroom Shower/Tub: Walk-in shower;Tub only   Bathroom Toilet: Standard (Comfort height)     Home Equipment: Hand held shower head;Walker - 2 wheels;Walker - standard;Crutches   Additional Comments: DME borrowed from church members      Prior Functioning/Environment Level of Independence: Independent                 OT Problem List: Decreased range of motion;Decreased activity tolerance;Impaired balance (sitting and/or standing);Decreased safety awareness;Decreased knowledge of use of DME or AE;Decreased knowledge of precautions;Pain      OT Treatment/Interventions:      OT Goals(Current goals can be found in the care plan section) Acute Rehab OT Goals Patient  Stated Goal: Return home OT Goal Formulation: With patient Time For Goal Achievement: 07/16/17 Potential to Achieve Goals: Good  OT Frequency:     Barriers to D/C:            Co-evaluation              AM-PAC PT "6 Clicks" Daily Activity     Outcome Measure Help from another person eating meals?: None Help from another person taking care of personal grooming?: A Little Help from another person toileting, which includes using toliet, bedpan, or urinal?: A Little Help from another person bathing (including washing, rinsing, drying)?: A Little Help from another person to put on and taking off regular upper body clothing?: None Help from another person to put on and taking off regular lower body clothing?: A Little 6 Click Score: 20   End of Session Equipment Utilized During Treatment: Gait belt;Rolling walker Nurse Communication: Mobility status;Weight bearing status  Activity Tolerance: Patient tolerated treatment well Patient left: in chair;with call bell/phone within reach (with PT)  OT Visit Diagnosis: Unsteadiness on feet (R26.81);Other abnormalities of gait and mobility (R26.89);Pain Pain - Right/Left: Left Pain - part of body: Leg                Time: 6812-7517 OT Time Calculation (min): 26 min Charges:  OT General Charges $OT Visit: 1 Visit OT Evaluation $OT Eval Moderate Complexity: 1 Mod OT Treatments $Self Care/Home Management : 8-22 mins G-Codes:     Tanganika Barradas MSOT, OTR/L Acute Rehab Pager:  [phone] Office: [place] 07/02/2017, 10:00 AM

## 2017-07-02 NOTE — Care Management Obs Status (Signed)
[facility] NOTIFICATION   Patient Details  Name: TARIA CASTRILLO MRN: [id number] Date of Birth: 05/09/1951   Medicare Observation Status Notification Given:  Yes    Carles Collet, RN 07/02/2017, 10:44 AM

## 2017-07-02 NOTE — Care Management CC44 (Signed)
Condition Code 44 Documentation Completed  Patient Details  Name: ZORANA BROCKWELL MRN: [id number] Date of Birth: [birth year]   Condition Code 44 given:  Yes Patient signature on Condition Code 44 notice:  Yes Documentation of 2 MD's agreement:  Yes Code 44 added to claim:  Yes    Carles Collet, RN 07/02/2017, 10:44 AM

## 2017-07-02 NOTE — Care Management Note (Signed)
Case Management Note  Patient Details  Name: [name] MRN: [id number] Date of Birth: [birth year]  Subjective/Objective:                 Spoke w patient at the bedside, She stated that she has all DME at home already. No DME or HH needs. DC to home self care. CM signing off.   Action/Plan:   Expected Discharge Date:  07/02/17               Expected Discharge Plan:  Home/Self Care  In-House Referral:     Discharge planning Services  CM Consult  Post Acute Care Choice:    Choice offered to:     DME Arranged:    DME Agency:     HH Arranged:    HH Agency:     Status of Service:  Completed, signed off  If discussed at [employer] of Stay Meetings, dates discussed:    Additional Comments:  Carles Collet, RN 07/02/2017, 9:25 AM

## 2017-07-02 NOTE — Progress Notes (Signed)
Orthopedic Tech Progress Note Patient Details:  [name] [birth year] [id number]  Ortho Devices Type of Ortho Device: Ace wrap, Short leg splint Ortho Device/Splint Interventions: Application   [place] Pink 07/02/2017, 9:12 AM

## 2017-07-02 NOTE — Discharge Summary (Signed)
Physician Discharge Summary      Patient ID: [name] MRN: [id number] DOB/AGE: 68-Dec-1952 68 y.o.  Admit date: 07/01/2017 Discharge date: 07/02/2017  Admission Diagnoses:  Closed displaced comminuted fracture of shaft of left tibia  Discharge Diagnoses:  Principal Problem:   Closed displaced comminuted fracture of shaft of left tibia Active Problems:   Bimalleolar ankle fracture, left, closed, initial encounter   History of open reduction and internal fixation (ORIF) procedure   Past Medical History:  Diagnosis Date  . Arthritis    "right hip; hands" (07/01/2017)  . Cancer of left breast [facility]) 08/2003   S/p left breast mastectomy  . Carpal tunnel syndrome, bilateral   . PONV (postoperative nausea and vomiting)     Surgeries: Procedure(s): OPEN REDUCTION INTERNAL FIXATION (ORIF) LEFT TIBIA/PILON FRACTURE/FIBULA FRACTURE on 07/01/2017   Consultants (if any):   Discharged Condition: Improved  Hospital Course: [name] is an 66 y.o. female who was admitted 07/01/2017 with a diagnosis of Closed displaced comminuted fracture of shaft of left tibia and went to the operating room on 07/01/2017 and underwent the above named procedures.    She was given perioperative antibiotics:  Anti-infectives    Start     Dose/Rate Route Frequency Ordered Stop   07/01/17 1845  ceFAZolin (ANCEF) IVPB 2g/100 mL premix     2 g 200 mL/hr over 30 Minutes Intravenous Every 6 hours 07/01/17 1633 07/02/17 1244   07/01/17 1030  ceFAZolin (ANCEF) IVPB 2g/100 mL premix     2 g 200 mL/hr over 30 Minutes Intravenous On call to O.R. 07/01/17 1016 07/01/17 1303   07/01/17 1022  ceFAZolin (ANCEF) 2-4 GM/100ML-% IVPB    Comments:  Nyoka Cowden   : cabinet override      07/01/17 1022 07/01/17 1248    .  She was given sequential compression devices, early ambulation, and aspirin for DVT prophylaxis.  She benefited maximally from the hospital stay and there were no complications.    Recent  vital signs:  Vitals:   07/01/17 2100 07/02/17 0606  BP: (!) 134/55 (!) 122/51  Pulse: 80 61  Resp: 16 16  Temp: 98.7 F (37.1 C) 99.5 F (37.5 C)  SpO2: 93% 95%    Recent laboratory studies:  Lab Results  Component Value Date   HGB 12.5 07/01/2017   HGB 13.1 05/16/2013   HGB 12.4 05/15/2012   Lab Results  Component Value Date   WBC 9.8 07/01/2017   PLT 216 07/01/2017   No results found for: INR Lab Results  Component Value Date   NA 138 07/02/2017   K 3.8 07/02/2017   CL 105 07/02/2017   CO2 26 07/02/2017   BUN 9 07/02/2017   CREATININE 0.67 07/02/2017   GLUCOSE 101 (H) 07/02/2017    Discharge Medications:   Allergies as of 07/02/2017   No Known Allergies     Medication List    STOP taking these medications   HYDROcodone-acetaminophen 5-325 MG tablet Commonly known as:  NORCO/VICODIN     TAKE these medications   aspirin EC 325 MG tablet Take 1 tablet (325 mg total) by mouth 2 (two) times daily.   calcium carbonate 600 MG Tabs tablet Commonly known as:  OS-CAL Take 600 mg by mouth 2 (two) times daily with a meal.   ECHINACEA PO Take 2 tablets by mouth as needed.   glucosamine-chondroitin 500-400 MG tablet Take 1 tablet by mouth 2 (two) times daily.   Krill Oil 500 MG  Caps Take 500 mg by mouth daily.   multivitamin with minerals tablet Take 1 tablet by mouth daily.   ondansetron 4 MG tablet Commonly known as:  ZOFRAN Take 1-2 tablets (4-8 mg total) by mouth every 8 (eight) hours as needed for nausea or vomiting.   oxyCODONE 10 mg 12 hr tablet Commonly known as:  OXYCONTIN Take 1 tablet (10 mg total) by mouth every 12 (twelve) hours.   oxyCODONE 5 MG immediate release tablet Commonly known as:  Oxy IR/ROXICODONE Take 1-3 tablets (5-15 mg total) by mouth every 4 (four) hours as needed.   promethazine 25 MG tablet Commonly known as:  PHENERGAN Take 1 tablet (25 mg total) by mouth every 6 (six) hours as needed for nausea.   senna-docusate  8.6-50 MG tablet Commonly known as:  SENOKOT S Take 1 tablet by mouth at bedtime as needed.   tiZANidine 4 MG tablet Commonly known as:  ZANAFLEX Take 1 tablet (4 mg total) by mouth every 6 (six) hours as needed for muscle spasms.   vitamin C 250 MG tablet Commonly known as:  ASCORBIC ACID Take 250 mg by mouth daily as needed (cold symptoms).   Vitamin D 2000 units tablet Take 2,000 Units by mouth daily.   zinc sulfate 220 (50 Zn) MG capsule Take 1 capsule (220 mg total) by mouth daily.            Durable Medical Equipment        Start     Ordered   07/01/17 1634  DME Walker rolling  Once    Question:  Patient needs a walker to treat with the following condition  Answer:  History of open reduction and internal fixation (ORIF) procedure   07/01/17 1633   07/01/17 1634  DME 3 n 1  Once     07/01/17 1633   07/01/17 1634  DME Bedside commode  Once    Question:  Patient needs a bedside commode to treat with the following condition  Answer:  History of open reduction and internal fixation (ORIF) procedure   07/01/17 1633       Discharge Care Instructions        Start     Ordered   07/01/17 0000  Non weight bearing     07/01/17 1528      Diagnostic Studies: Dg Ankle Complete Left  Result Date: 07/01/2017 CLINICAL DATA:  Intraoperative imaging for fixation of distal left tibial and fibular fractures. Mechanism unknown. EXAM: DG C-ARM 61-120 MIN; LEFT ANKLE COMPLETE - 3+ VIEW COMPARISON:  Imaging of the left tibia and fibula performed at [facility] 07/01/2017. FINDINGS: Six fluoroscopic intraoperative spot views of the left lower leg demonstrate medial and lateral plate and screws in place for fixation of distal tibial and fibular fractures. Hardware is intact. Position and alignment are markedly improved. No acute abnormality. IMPRESSION: Intraoperative imaging for ORIF of left lower leg fractures. Electronically Signed   By: Inge Rise M.D.   On: 07/01/2017  15:17   Dg Tibia/fibula Left Port  Result Date: 07/01/2017 CLINICAL DATA:  POST OP OPEN REDUCTION INTERNAL FIXATION (ORIF) LEFT TIBIA/PILON FRACTURE/FIBULA FRACTURE. EXAM: PORTABLE LEFT TIBIA AND FIBULA - 2 VIEW COMPARISON:  Current operative images and preoperative images dated 07/01/2017. FINDINGS: Comminuted fracture of the distal tibia has been reduced with a medial fixation plate and multiple screws. The fracture components are in near anatomic alignment. The orthopedic hardware appears well-seated. A lateral fixation plate and screws reduce the comminuted distal  fibular fracture into near anatomic alignment. The ankle mortise is normally aligned. IMPRESSION: 1. Well aligned fractures following ORIF of distal left tibia and fibular fractures. Electronically Signed   By: Lajean Manes M.D.   On: 07/01/2017 16:13   Dg C-arm 61-120 Min  Result Date: 07/01/2017 CLINICAL DATA:  Intraoperative imaging for fixation of distal left tibial and fibular fractures. Mechanism unknown. EXAM: DG C-ARM 61-120 MIN; LEFT ANKLE COMPLETE - 3+ VIEW COMPARISON:  Imaging of the left tibia and fibula performed at [facility] 07/01/2017. FINDINGS: Six fluoroscopic intraoperative spot views of the left lower leg demonstrate medial and lateral plate and screws in place for fixation of distal tibial and fibular fractures. Hardware is intact. Position and alignment are markedly improved. No acute abnormality. IMPRESSION: Intraoperative imaging for ORIF of left lower leg fractures. Electronically Signed   By: Inge Rise M.D.   On: 07/01/2017 15:17   Xr Ankle Complete Left  Result Date: 07/01/2017 Comminuted distal tibial shaft fracture with bimalleolar ankle fracture  Xr Tibia/fibula Left  Result Date: 07/01/2017 Comminuted displaced distal tibia fracture and fibula fracture with medial malleolus fracture   Disposition:   Discharge Instructions    Call MD / Call 911    Complete by:  As directed    If  you experience chest pain or shortness of breath, CALL 911 and be transported to the hospital emergency room.  If you develope a fever above 101.5 F, pus (white drainage) or increased drainage or redness at the wound, or calf pain, call your surgeon's office.   Constipation Prevention    Complete by:  As directed    Drink plenty of fluids.  Prune juice may be helpful.  You may use a stool softener, such as Colace (over the counter) 100 mg twice a day.  Use MiraLax (over the counter) for constipation as needed.   Driving restrictions    Complete by:  As directed    No driving while taking narcotic pain meds.   Increase activity slowly as tolerated    Complete by:  As directed    Non weight bearing    Complete by:  As directed       Follow-up Information    Leandrew Koyanagi, MD In 2 weeks.   Specialty:  Orthopedic Surgery Why:  For suture removal, For wound re-check Contact information: [place] [phone]            Signed: Eduard Roux 07/02/2017, 8:24 AM

## 2017-07-02 NOTE — Progress Notes (Signed)
Physical Therapy Treatment Patient Details Name: [name] MRN: [id number] DOB: March 22, 1951 Today's Date: 07/02/2017    History of Present Illness Pt is a 66 y.o. female who presents for surgical treatment of left tibia/fibula fracture; now s/p L tibia/fibula ORIF and L ankle ORIF (NWB). Pertinent PMH includes arthritis, breast CA.     PT Comments    Patient doing well today. OT present in room upon PT arrival. Patient able to ambulate within hallway with hop-to pattern with RW with good safety and no episode of LOB noted. Stair training today on 8"step with single handrail and unilateral crutch, as patient does not feel comfortable using RW for stair navigation. Patient also trained today on knee walker with good safety and utilization of AD. Will plan to continue to follow acutely to maximize functional mobility prior to d/c home.     Follow Up Recommendations  No PT follow up;Supervision for mobility/OOB     Equipment Recommendations  None recommended by PT    Recommendations for Other Services       Precautions / Restrictions Precautions Precautions: Fall Restrictions Weight Bearing Restrictions: Yes LLE Weight Bearing: Non weight bearing    Mobility  Bed Mobility Overal bed mobility: Modified Independent             General bed mobility comments: Requires increased time  Transfers Overall transfer level: Needs assistance Equipment used: Rolling walker (2 wheeled) Transfers: Sit to/from Stand Sit to Stand: Min guard         General transfer comment: Min guard for safety and sequncing with proper hand placement  Ambulation/Gait Ambulation/Gait assistance: Min guard Ambulation Distance (Feet): 120 Feet Assistive device: Rolling walker (2 wheeled)       General Gait Details: Patient ambulating with hop-to apttern due to NWB restriction. Good balance with no episode of LOB - general fatigue and R sided hip pain limiting further ambulation; patient  utilizing knee walker for return to room with good safety and utilization of AD.    Stairs Stairs: Yes   Stair Management: One rail Right;With crutches Number of Stairs: 2 General stair comments: education on sequencing and safety with 1 HR and 1 crutch. Handout on use of RW with stairs.  Wheelchair Mobility    Modified Rankin (Stroke Patients Only)       Balance Overall balance assessment: Needs assistance Sitting-balance support: No upper extremity supported;Feet supported;Feet unsupported Sitting balance-Leahy Scale: Good     Standing balance support: Bilateral upper extremity supported;During functional activity Standing balance-Leahy Scale: Fair Standing balance comment: requires Geophysicist/field seismologist for safety                            Cognition Arousal/Alertness: Awake/alert Behavior During Therapy: WFL for tasks assessed/performed Overall Cognitive Status: Within Functional Limits for tasks assessed                                        Exercises      General Comments General comments (skin integrity, edema, etc.): Edema in LLE with new ace wrapping      Pertinent Vitals/Pain Pain Assessment: 0-10 Pain Score: 5  Pain Location: LLE Pain Descriptors / Indicators: Aching;Discomfort Pain Intervention(s): Limited activity within patient's tolerance;Monitored during session;Repositioned    Home Living Family/patient expects to be discharged to:: Private residence Living Arrangements: Spouse/significant other Available Help at Discharge:  Family;Available 24 hours/day Type of Home: House Home Access: Stairs to enter Entrance Stairs-Rails: Right Home Layout: Multi-level;Able to live on main level with bedroom/bathroom Home Equipment: Hand held shower head;Walker - 2 wheels;Walker - standard;Crutches Additional Comments: DME borrowed from [employer] members    Prior Function Level of Independence: Independent          PT Goals (current  goals can now be found in the care plan section) Acute Rehab PT Goals Patient Stated Goal: Return home PT Goal Formulation: With patient Time For Goal Achievement: 07/15/17 Potential to Achieve Goals: Good    Frequency    Min 5X/week      PT Plan Current plan remains appropriate    Co-evaluation              AM-PAC PT "6 Clicks" Daily Activity  Outcome Measure  Difficulty turning over in bed (including adjusting bedclothes, sheets and blankets)?: None Difficulty moving from lying on back to sitting on the side of the bed? : None Difficulty sitting down on and standing up from a chair with arms (e.g., wheelchair, bedside commode, etc,.)?: A Little Help needed moving to and from a bed to chair (including a wheelchair)?: A Little Help needed walking in hospital room?: A Little Help needed climbing 3-5 steps with a railing? : A Lot 6 Click Score: 19    End of Session Equipment Utilized During Treatment: Gait belt Activity Tolerance: Patient tolerated treatment well Patient left: in chair;with call bell/phone within reach   PT Visit Diagnosis: Other abnormalities of gait and mobility (R26.89)     Time: 0177-9390 PT Time Calculation (min) (ACUTE ONLY): 26 min  Charges:  $Gait Training: 23-37 mins                    G Codes:       Lanney Gins, PT, DPT 07/02/17 10:24 AM

## 2017-07-04 ENCOUNTER — Other Ambulatory Visit (INDEPENDENT_AMBULATORY_CARE_PROVIDER_SITE_OTHER): Payer: Self-pay | Admitting: Orthopaedic Surgery

## 2017-07-04 ENCOUNTER — Telehealth (INDEPENDENT_AMBULATORY_CARE_PROVIDER_SITE_OTHER): Payer: Self-pay | Admitting: Orthopaedic Surgery

## 2017-07-04 MED ORDER — OXYCODONE HCL 5 MG PO TABS: 5.0000 mg | ORAL_TABLET | ORAL | 0 refills | Status: DC | PRN

## 2017-07-04 NOTE — Telephone Encounter (Signed)
Gave Rx for Oxycodone today.  No more Refill on OxyContin correct?

## 2017-07-04 NOTE — Telephone Encounter (Signed)
rx and handicap given

## 2017-07-04 NOTE — Telephone Encounter (Signed)
Patient's husband also stated that she will be out of OxyContin in a couple of days and will need more.  CB# [phone].  Thank you.

## 2017-07-04 NOTE — Telephone Encounter (Signed)
Please advise 

## 2017-07-04 NOTE — Telephone Encounter (Signed)
Patient's husband called requesting an RX refill on his wife's oxycodone.  He wanted to know if it is okay to take the oxycodone and the OxyContin when the her pill regiment calls for them to be taken at the same time.  They are also needing a handicap plaquered.  CB#[phone].  Thank you.

## 2017-07-04 NOTE — Telephone Encounter (Signed)
Yes its ok

## 2017-07-04 NOTE — Anesthesia Postprocedure Evaluation (Signed)
Anesthesia Post Note  Patient: [name]  Procedure(s) Performed: OPEN REDUCTION INTERNAL FIXATION (ORIF) LEFT TIBIA/PILON FRACTURE/FIBULA FRACTURE (Left Ankle)     Patient location during evaluation: PACU Anesthesia Type: General Level of consciousness: awake Pain management: pain level controlled Vital Signs Assessment: post-procedure vital signs reviewed and stable Respiratory status: spontaneous breathing Cardiovascular status: stable Postop Assessment: patient able to bend at knees, no apparent nausea or vomiting and adequate PO intake Anesthetic complications: no    Last Vitals:  Vitals:   07/01/17 2100 07/02/17 0606  BP: (!) 134/55 (!) 122/51  Pulse: 80 61  Resp: 16 16  Temp: 37.1 C 37.5 C  SpO2: 93% 95%    Last Pain:  Vitals:   07/02/17 1303  TempSrc:   PainSc: 4    Pain Goal: Patients Stated Pain Goal: 0 (07/01/17 2214)               Katerine Morua JR,JOHN Mateo Flow

## 2017-07-04 NOTE — Telephone Encounter (Signed)
No oxycontin refill.  It's just for 5 days

## 2017-07-05 ENCOUNTER — Telehealth (INDEPENDENT_AMBULATORY_CARE_PROVIDER_SITE_OTHER): Payer: Self-pay | Admitting: Radiology

## 2017-07-05 NOTE — Telephone Encounter (Signed)
[facility] and they did not know how to back date the auth, they asked me to call pharm to see if they could post date the Rx, this cannot be done.  I have asked pharm to call them for me to see if they can get this situated.  Pharm will call pt as well after this and tell her how to proceed.

## 2017-07-05 NOTE — Telephone Encounter (Signed)
I spoke with husband this morning and advised him no refill of the oxycontin.  The postop Rx she received  is now approved through ins.

## 2017-07-05 NOTE — Progress Notes (Signed)
   07/01/17 1753  PT G-Codes **NOT FOR INPATIENT CLASS**  Functional Assessment Tool Used AM-PAC 6 Clicks Basic Mobility  Functional Limitation Mobility: Walking and moving around  Mobility: Walking and Moving Around Current Status (M6219) CJ  Mobility: Walking and Moving Around Goal Status (I7125) CI   Late entry for missed G code  Mabeline Caras, PT, DPT Acute Rehab Services  Pager: [phone]

## 2017-07-07 ENCOUNTER — Encounter (HOSPITAL_COMMUNITY): Payer: Self-pay | Admitting: Orthopaedic Surgery

## 2017-07-08 ENCOUNTER — Telehealth (INDEPENDENT_AMBULATORY_CARE_PROVIDER_SITE_OTHER): Payer: Self-pay | Admitting: Radiology

## 2017-07-08 MED ORDER — OXYCODONE HCL 5 MG PO TABS: 5.0000 mg | ORAL_TABLET | Freq: Three times a day (TID) | ORAL | 0 refills | Status: DC | PRN

## 2017-07-08 NOTE — Telephone Encounter (Signed)
Patient calling about pain medication. Was taking oxycodone and oxycontin. Will be out of medications. Patient to dc oxycontin at this time and just take oxycodone. New rx written per Dr. Erlinda Hong. Advised it would be left at front desk, encouraged elevation.

## 2017-07-09 NOTE — Progress Notes (Addendum)
OT Evaluation - Late G-code Entry    07/02/17 0900  OT Time Calculation  OT Start Time (ACUTE ONLY) 0901  OT Stop Time (ACUTE ONLY) 0927  OT Time Calculation (min) 26 min  OT G-codes **NOT FOR INPATIENT CLASS**  Functional Assessment Tool Used Clinical judgement  Functional Limitation Self care  Self Care Current Status (H1834) CJ  Self Care Goal Status (P7357) CI  Self Care Discharge Status (I9784) [place], OTR/L Acute Rehab Pager: [phone] Office: [phone]

## 2017-07-13 ENCOUNTER — Telehealth (INDEPENDENT_AMBULATORY_CARE_PROVIDER_SITE_OTHER): Payer: Self-pay | Admitting: Orthopaedic Surgery

## 2017-07-13 MED ORDER — OXYCODONE-ACETAMINOPHEN 5-325 MG PO TABS: 1.0000 | ORAL_TABLET | ORAL | 0 refills | Status: DC | PRN

## 2017-07-13 NOTE — Addendum Note (Signed)
Addended by: Precious Bard on: 07/13/2017 11:25 AM   Modules accepted: Orders

## 2017-07-13 NOTE — Telephone Encounter (Signed)
Patient called asking for a refill on oxycodone. CB # [phone]

## 2017-07-13 NOTE — Telephone Encounter (Signed)
Rx pending signature. Dr Erlinda Hong is in Surgery all day today and Rx will be ready for pick up tomorrow morning.

## 2017-07-13 NOTE — Telephone Encounter (Signed)
Please advise 

## 2017-07-13 NOTE — Telephone Encounter (Signed)
Yes #30

## 2017-07-14 NOTE — Telephone Encounter (Signed)
Rx ready for pick up. Patient aware.

## 2017-07-15 ENCOUNTER — Ambulatory Visit (INDEPENDENT_AMBULATORY_CARE_PROVIDER_SITE_OTHER): Payer: PPO | Admitting: Orthopaedic Surgery

## 2017-07-15 ENCOUNTER — Ambulatory Visit (INDEPENDENT_AMBULATORY_CARE_PROVIDER_SITE_OTHER): Payer: PPO

## 2017-07-15 DIAGNOSIS — S82252D Displaced comminuted fracture of shaft of left tibia, subsequent encounter for closed fracture with routine healing: Secondary | ICD-10-CM | POA: Diagnosis not present

## 2017-07-15 MED ORDER — TRAMADOL HCL 50 MG PO TABS: 50.0000 mg | ORAL_TABLET | Freq: Three times a day (TID) | ORAL | 2 refills | Status: DC | PRN

## 2017-07-15 NOTE — Progress Notes (Signed)
Patient is two-week status post ORIF left tibia and bimalleolar ankle fracture.  She is doing well overall.  She is nonweightbearing and using a knee scooter.  She does not have any real complaints today.  Incisions have all healed without any signs of infection or drainage.  She has the expected postoperative bruising and swelling.  Foot is warm well perfused.  X-rays show stable fixation without any complications.  Sutures were removed today.  Continue nonweightbearing.  She was transitioned to a Cam walker.  Tramadol prescription.  Home health physical therapy prescription.

## 2017-07-19 ENCOUNTER — Other Ambulatory Visit (INDEPENDENT_AMBULATORY_CARE_PROVIDER_SITE_OTHER): Payer: Self-pay

## 2017-07-19 DIAGNOSIS — S82842A Displaced bimalleolar fracture of left lower leg, initial encounter for closed fracture: Secondary | ICD-10-CM

## 2017-07-25 ENCOUNTER — Telehealth (INDEPENDENT_AMBULATORY_CARE_PROVIDER_SITE_OTHER): Payer: Self-pay | Admitting: Orthopaedic Surgery

## 2017-07-25 NOTE — Telephone Encounter (Signed)
Patient called and was checking on status of referral for physical therapy. She would like to know the name of the office so she can call. Patient CB # [phone]

## 2017-07-27 NOTE — Telephone Encounter (Signed)
Do you want patient to go out to do  PT ([facility]) or do you want patient to do HHPT?

## 2017-07-27 NOTE — Telephone Encounter (Signed)
It was supposed to be home health physical therapy.  Let us do [facility]

## 2017-07-28 NOTE — Telephone Encounter (Signed)
Faxed rx for HHPT to [facility] to let her know someone will contact her to sched appt for HHPT

## 2017-08-02 ENCOUNTER — Telehealth (INDEPENDENT_AMBULATORY_CARE_PROVIDER_SITE_OTHER): Payer: Self-pay | Admitting: Orthopaedic Surgery

## 2017-08-02 ENCOUNTER — Ambulatory Visit: Payer: PPO

## 2017-08-02 ENCOUNTER — Other Ambulatory Visit (INDEPENDENT_AMBULATORY_CARE_PROVIDER_SITE_OTHER): Payer: Self-pay

## 2017-08-02 NOTE — Telephone Encounter (Signed)
Patient called stating that the home health facility ([facility]) has not received anything to schedule her home health.  CB#[phone].  Thank you.

## 2017-08-02 NOTE — Telephone Encounter (Signed)
I called [name] and asked for return call. I see that an order was to be sent to [facility] for HHPT s/p bimal ankle fx but I do not see rx in chart. I wrote order and faxed to [facility] today and awaiting [name] to call back and advise when they can get out and see this pt before I call her back I want to have answers for her.

## 2017-08-04 ENCOUNTER — Telehealth (INDEPENDENT_AMBULATORY_CARE_PROVIDER_SITE_OTHER): Payer: Self-pay | Admitting: Orthopaedic Surgery

## 2017-08-04 NOTE — Telephone Encounter (Signed)
Yes we can do outpatient PT to [facility].  Just 1 session to learn exercises.  NWB.

## 2017-08-04 NOTE — Telephone Encounter (Signed)
Patient is unable to do HHPT bc she would have to pay out of pocket. Do you want her to do out patient pt instead? Please advise.

## 2017-08-04 NOTE — Telephone Encounter (Signed)
Patient called asking about her HHPT orders. If you could give her a call back with the details at [phone]

## 2017-08-04 NOTE — Telephone Encounter (Signed)
xu pt 

## 2017-08-05 NOTE — Telephone Encounter (Signed)
Tried to call patient no answer LMOM to return our call to advise on message below. I faxed over referral for physical therapy and they will call her to schedule appt.  She does not need to do anything just wait for the phone call to sched appt.

## 2017-08-09 NOTE — Telephone Encounter (Signed)
IC patient and advised that we have referred her to [facility] and she will call them to sched an appt. Order faxed per Liz's note

## 2017-08-10 DIAGNOSIS — M25572 Pain in left ankle and joints of left foot: Secondary | ICD-10-CM | POA: Diagnosis not present

## 2017-08-10 DIAGNOSIS — M25472 Effusion, left ankle: Secondary | ICD-10-CM | POA: Diagnosis not present

## 2017-08-10 DIAGNOSIS — R269 Unspecified abnormalities of gait and mobility: Secondary | ICD-10-CM | POA: Diagnosis not present

## 2017-08-10 DIAGNOSIS — M6281 Muscle weakness (generalized): Secondary | ICD-10-CM | POA: Diagnosis not present

## 2017-08-11 ENCOUNTER — Telehealth (INDEPENDENT_AMBULATORY_CARE_PROVIDER_SITE_OTHER): Payer: Self-pay | Admitting: *Deleted

## 2017-08-11 NOTE — Telephone Encounter (Signed)
Received fax from [facility] advising pt has appt scheduled 08/10/17 at 300pm, per fax pt is aware of appt.

## 2017-08-15 ENCOUNTER — Ambulatory Visit (INDEPENDENT_AMBULATORY_CARE_PROVIDER_SITE_OTHER): Payer: PPO

## 2017-08-15 ENCOUNTER — Encounter (INDEPENDENT_AMBULATORY_CARE_PROVIDER_SITE_OTHER): Payer: Self-pay | Admitting: Orthopaedic Surgery

## 2017-08-15 ENCOUNTER — Ambulatory Visit (INDEPENDENT_AMBULATORY_CARE_PROVIDER_SITE_OTHER): Payer: PPO | Admitting: Orthopaedic Surgery

## 2017-08-15 DIAGNOSIS — S82842A Displaced bimalleolar fracture of left lower leg, initial encounter for closed fracture: Secondary | ICD-10-CM

## 2017-08-15 DIAGNOSIS — S82252D Displaced comminuted fracture of shaft of left tibia, subsequent encounter for closed fracture with routine healing: Secondary | ICD-10-CM

## 2017-08-15 NOTE — Progress Notes (Signed)
Patient is 6 weeks status post ORIF left tibia and fibula fracture with bimalleolar ankle component.  She is overall doing well.  Her range of motion of her ankle was excellent.  Her surgical scars are healed with just a small area where the suture appears to be spitting out.  There is no drainage or signs of infection.  X-rays show stable fixation and alignment.  At this point we can allow her to weight-bear as tolerated in a fracture boot.  Referral to outpatient physical therapy was made.  Questions encouraged and answered.  Follow-up in 6 weeks with 3 view x-rays of the left ankle.

## 2017-08-16 DIAGNOSIS — R262 Difficulty in walking, not elsewhere classified: Secondary | ICD-10-CM | POA: Diagnosis not present

## 2017-08-16 DIAGNOSIS — M25672 Stiffness of left ankle, not elsewhere classified: Secondary | ICD-10-CM | POA: Diagnosis not present

## 2017-08-16 DIAGNOSIS — M25472 Effusion, left ankle: Secondary | ICD-10-CM | POA: Diagnosis not present

## 2017-08-16 DIAGNOSIS — M25572 Pain in left ankle and joints of left foot: Secondary | ICD-10-CM | POA: Diagnosis not present

## 2017-08-19 DIAGNOSIS — R262 Difficulty in walking, not elsewhere classified: Secondary | ICD-10-CM | POA: Diagnosis not present

## 2017-08-19 DIAGNOSIS — M25672 Stiffness of left ankle, not elsewhere classified: Secondary | ICD-10-CM | POA: Diagnosis not present

## 2017-08-19 DIAGNOSIS — M25572 Pain in left ankle and joints of left foot: Secondary | ICD-10-CM | POA: Diagnosis not present

## 2017-08-19 DIAGNOSIS — M25472 Effusion, left ankle: Secondary | ICD-10-CM | POA: Diagnosis not present

## 2017-08-22 DIAGNOSIS — M25672 Stiffness of left ankle, not elsewhere classified: Secondary | ICD-10-CM | POA: Diagnosis not present

## 2017-08-22 DIAGNOSIS — R262 Difficulty in walking, not elsewhere classified: Secondary | ICD-10-CM | POA: Diagnosis not present

## 2017-08-22 DIAGNOSIS — M25472 Effusion, left ankle: Secondary | ICD-10-CM | POA: Diagnosis not present

## 2017-08-22 DIAGNOSIS — M25572 Pain in left ankle and joints of left foot: Secondary | ICD-10-CM | POA: Diagnosis not present

## 2017-08-24 DIAGNOSIS — M25672 Stiffness of left ankle, not elsewhere classified: Secondary | ICD-10-CM | POA: Diagnosis not present

## 2017-08-24 DIAGNOSIS — M25572 Pain in left ankle and joints of left foot: Secondary | ICD-10-CM | POA: Diagnosis not present

## 2017-08-24 DIAGNOSIS — R262 Difficulty in walking, not elsewhere classified: Secondary | ICD-10-CM | POA: Diagnosis not present

## 2017-08-24 DIAGNOSIS — M25472 Effusion, left ankle: Secondary | ICD-10-CM | POA: Diagnosis not present

## 2017-08-26 DIAGNOSIS — M25472 Effusion, left ankle: Secondary | ICD-10-CM | POA: Diagnosis not present

## 2017-08-26 DIAGNOSIS — M25572 Pain in left ankle and joints of left foot: Secondary | ICD-10-CM | POA: Diagnosis not present

## 2017-08-26 DIAGNOSIS — M25672 Stiffness of left ankle, not elsewhere classified: Secondary | ICD-10-CM | POA: Diagnosis not present

## 2017-08-26 DIAGNOSIS — R262 Difficulty in walking, not elsewhere classified: Secondary | ICD-10-CM | POA: Diagnosis not present

## 2017-08-31 ENCOUNTER — Telehealth (INDEPENDENT_AMBULATORY_CARE_PROVIDER_SITE_OTHER): Payer: Self-pay | Admitting: Orthopaedic Surgery

## 2017-08-31 DIAGNOSIS — M25472 Effusion, left ankle: Secondary | ICD-10-CM | POA: Diagnosis not present

## 2017-08-31 DIAGNOSIS — R262 Difficulty in walking, not elsewhere classified: Secondary | ICD-10-CM | POA: Diagnosis not present

## 2017-08-31 DIAGNOSIS — M25572 Pain in left ankle and joints of left foot: Secondary | ICD-10-CM | POA: Diagnosis not present

## 2017-08-31 DIAGNOSIS — M25672 Stiffness of left ankle, not elsewhere classified: Secondary | ICD-10-CM | POA: Diagnosis not present

## 2017-08-31 NOTE — Telephone Encounter (Signed)
Patient called requesting an RX refill on her Tramadol.  CB#[phone]

## 2017-08-31 NOTE — Telephone Encounter (Signed)
30

## 2017-08-31 NOTE — Telephone Encounter (Signed)
Please advise 

## 2017-09-01 ENCOUNTER — Other Ambulatory Visit (INDEPENDENT_AMBULATORY_CARE_PROVIDER_SITE_OTHER): Payer: Self-pay

## 2017-09-01 MED ORDER — TRAMADOL HCL 50 MG PO TABS: ORAL_TABLET | ORAL | 0 refills | Status: DC

## 2017-09-01 NOTE — Telephone Encounter (Signed)
Called Rx into pharm. Patient aware.  

## 2017-09-02 DIAGNOSIS — M25672 Stiffness of left ankle, not elsewhere classified: Secondary | ICD-10-CM | POA: Diagnosis not present

## 2017-09-02 DIAGNOSIS — R262 Difficulty in walking, not elsewhere classified: Secondary | ICD-10-CM | POA: Diagnosis not present

## 2017-09-02 DIAGNOSIS — M25472 Effusion, left ankle: Secondary | ICD-10-CM | POA: Diagnosis not present

## 2017-09-02 DIAGNOSIS — M25572 Pain in left ankle and joints of left foot: Secondary | ICD-10-CM | POA: Diagnosis not present

## 2017-09-05 DIAGNOSIS — R262 Difficulty in walking, not elsewhere classified: Secondary | ICD-10-CM | POA: Diagnosis not present

## 2017-09-05 DIAGNOSIS — M25672 Stiffness of left ankle, not elsewhere classified: Secondary | ICD-10-CM | POA: Diagnosis not present

## 2017-09-05 DIAGNOSIS — M25572 Pain in left ankle and joints of left foot: Secondary | ICD-10-CM | POA: Diagnosis not present

## 2017-09-05 DIAGNOSIS — M25472 Effusion, left ankle: Secondary | ICD-10-CM | POA: Diagnosis not present

## 2017-09-07 DIAGNOSIS — M25572 Pain in left ankle and joints of left foot: Secondary | ICD-10-CM | POA: Diagnosis not present

## 2017-09-07 DIAGNOSIS — M25672 Stiffness of left ankle, not elsewhere classified: Secondary | ICD-10-CM | POA: Diagnosis not present

## 2017-09-07 DIAGNOSIS — R262 Difficulty in walking, not elsewhere classified: Secondary | ICD-10-CM | POA: Diagnosis not present

## 2017-09-07 DIAGNOSIS — M25472 Effusion, left ankle: Secondary | ICD-10-CM | POA: Diagnosis not present

## 2017-09-09 DIAGNOSIS — M25472 Effusion, left ankle: Secondary | ICD-10-CM | POA: Diagnosis not present

## 2017-09-09 DIAGNOSIS — R262 Difficulty in walking, not elsewhere classified: Secondary | ICD-10-CM | POA: Diagnosis not present

## 2017-09-09 DIAGNOSIS — M25672 Stiffness of left ankle, not elsewhere classified: Secondary | ICD-10-CM | POA: Diagnosis not present

## 2017-09-09 DIAGNOSIS — M25572 Pain in left ankle and joints of left foot: Secondary | ICD-10-CM | POA: Diagnosis not present

## 2017-09-12 DIAGNOSIS — R262 Difficulty in walking, not elsewhere classified: Secondary | ICD-10-CM | POA: Diagnosis not present

## 2017-09-12 DIAGNOSIS — M25672 Stiffness of left ankle, not elsewhere classified: Secondary | ICD-10-CM | POA: Diagnosis not present

## 2017-09-12 DIAGNOSIS — M25572 Pain in left ankle and joints of left foot: Secondary | ICD-10-CM | POA: Diagnosis not present

## 2017-09-12 DIAGNOSIS — M25472 Effusion, left ankle: Secondary | ICD-10-CM | POA: Diagnosis not present

## 2017-09-14 ENCOUNTER — Telehealth (INDEPENDENT_AMBULATORY_CARE_PROVIDER_SITE_OTHER): Payer: Self-pay | Admitting: Orthopaedic Surgery

## 2017-09-14 NOTE — Telephone Encounter (Signed)
Called Patient and she states she is still having swelling & pain. She is doing PT. She is taking Tylenol during the day and Tramadol at Night. She has a scheduled appt for 09/26/16.  Is she able to take Ibuprofen?   Please advise CB: [phone]

## 2017-09-14 NOTE — Telephone Encounter (Signed)
Yes she can take ibuprofen.  It's normal to have occasional flare ups but if it doesn't get better after a few days, then she should come in.

## 2017-09-14 NOTE — Telephone Encounter (Signed)
Patient called stating she had some questions and that she had surgery on 11/2. CB # [phone]

## 2017-09-15 NOTE — Telephone Encounter (Signed)
IC patient and she says this is not really a flare up, but that she is in pretty much constant pain, appt made for next Monday to re evaluate.

## 2017-09-16 DIAGNOSIS — R262 Difficulty in walking, not elsewhere classified: Secondary | ICD-10-CM | POA: Diagnosis not present

## 2017-09-16 DIAGNOSIS — M25572 Pain in left ankle and joints of left foot: Secondary | ICD-10-CM | POA: Diagnosis not present

## 2017-09-16 DIAGNOSIS — M25472 Effusion, left ankle: Secondary | ICD-10-CM | POA: Diagnosis not present

## 2017-09-16 DIAGNOSIS — M25672 Stiffness of left ankle, not elsewhere classified: Secondary | ICD-10-CM | POA: Diagnosis not present

## 2017-09-19 ENCOUNTER — Encounter (INDEPENDENT_AMBULATORY_CARE_PROVIDER_SITE_OTHER): Payer: Self-pay | Admitting: Orthopaedic Surgery

## 2017-09-19 ENCOUNTER — Ambulatory Visit (INDEPENDENT_AMBULATORY_CARE_PROVIDER_SITE_OTHER): Payer: PPO

## 2017-09-19 ENCOUNTER — Ambulatory Visit (INDEPENDENT_AMBULATORY_CARE_PROVIDER_SITE_OTHER): Payer: PPO | Admitting: Orthopaedic Surgery

## 2017-09-19 DIAGNOSIS — S82842A Displaced bimalleolar fracture of left lower leg, initial encounter for closed fracture: Secondary | ICD-10-CM

## 2017-09-19 DIAGNOSIS — S82252D Displaced comminuted fracture of shaft of left tibia, subsequent encounter for closed fracture with routine healing: Secondary | ICD-10-CM

## 2017-09-19 MED ORDER — HYDROCODONE-ACETAMINOPHEN 5-325 MG PO TABS: 1.0000 | ORAL_TABLET | Freq: Every day | ORAL | 0 refills | Status: DC | PRN

## 2017-09-19 MED ORDER — TIZANIDINE HCL 4 MG PO TABS: 4.0000 mg | ORAL_TABLET | Freq: Four times a day (QID) | ORAL | 2 refills | Status: DC | PRN

## 2017-09-19 MED ORDER — TRAMADOL HCL 50 MG PO TABS: 50.0000 mg | ORAL_TABLET | Freq: Three times a day (TID) | ORAL | 2 refills | Status: DC | PRN

## 2017-09-19 NOTE — Progress Notes (Signed)
Patient is 80 days status post ORIF left tibia and medial malleolus fracture.  She is overall improving.  She feels that she should be further along than she is.  She is now ambulating with a cane.  She wears compression socks.  She does endorse some swelling.  She is pretty much not taking any more narcotic pain medicines.  Her  Exam is consistent with mild postsurgical swelling.  Foot is warm well perfused.  No evidence of infection.  Surgical scars are fully healed.  X-rays show improvement in healing and callus formation.  No hardware complications.  Reassurance was provided today that she is healing and progressing as expected.  She is not 3 months yet and I think she is doing quite well from my standpoint.  I do not see any issues.  I answered her questions and helped her set realistic expectations today.  Follow-up in 2 months with repeat three-view x-rays of the left ankle.

## 2017-09-20 DIAGNOSIS — M25572 Pain in left ankle and joints of left foot: Secondary | ICD-10-CM | POA: Diagnosis not present

## 2017-09-20 DIAGNOSIS — M25472 Effusion, left ankle: Secondary | ICD-10-CM | POA: Diagnosis not present

## 2017-09-20 DIAGNOSIS — M25672 Stiffness of left ankle, not elsewhere classified: Secondary | ICD-10-CM | POA: Diagnosis not present

## 2017-09-20 DIAGNOSIS — R262 Difficulty in walking, not elsewhere classified: Secondary | ICD-10-CM | POA: Diagnosis not present

## 2017-09-21 ENCOUNTER — Telehealth (INDEPENDENT_AMBULATORY_CARE_PROVIDER_SITE_OTHER): Payer: Self-pay | Admitting: Radiology

## 2017-09-21 NOTE — Telephone Encounter (Signed)
Do you know if this has been faxed yet?

## 2017-09-21 NOTE — Telephone Encounter (Signed)
Kathlee Nations calling from [facility] wanting initial evaluation signature from 08/24/2017. Any questions please call [phone].

## 2017-09-22 NOTE — Telephone Encounter (Signed)
Was faxed, and will refax

## 2017-09-23 DIAGNOSIS — M25572 Pain in left ankle and joints of left foot: Secondary | ICD-10-CM | POA: Diagnosis not present

## 2017-09-23 DIAGNOSIS — R262 Difficulty in walking, not elsewhere classified: Secondary | ICD-10-CM | POA: Diagnosis not present

## 2017-09-23 DIAGNOSIS — M25672 Stiffness of left ankle, not elsewhere classified: Secondary | ICD-10-CM | POA: Diagnosis not present

## 2017-09-23 DIAGNOSIS — M25472 Effusion, left ankle: Secondary | ICD-10-CM | POA: Diagnosis not present

## 2017-09-26 ENCOUNTER — Ambulatory Visit (INDEPENDENT_AMBULATORY_CARE_PROVIDER_SITE_OTHER): Payer: PPO | Admitting: Orthopaedic Surgery

## 2017-09-28 DIAGNOSIS — R262 Difficulty in walking, not elsewhere classified: Secondary | ICD-10-CM | POA: Diagnosis not present

## 2017-09-28 DIAGNOSIS — M25672 Stiffness of left ankle, not elsewhere classified: Secondary | ICD-10-CM | POA: Diagnosis not present

## 2017-09-28 DIAGNOSIS — M25472 Effusion, left ankle: Secondary | ICD-10-CM | POA: Diagnosis not present

## 2017-09-28 DIAGNOSIS — M25572 Pain in left ankle and joints of left foot: Secondary | ICD-10-CM | POA: Diagnosis not present

## 2017-10-04 DIAGNOSIS — M25472 Effusion, left ankle: Secondary | ICD-10-CM | POA: Diagnosis not present

## 2017-10-04 DIAGNOSIS — M25572 Pain in left ankle and joints of left foot: Secondary | ICD-10-CM | POA: Diagnosis not present

## 2017-10-04 DIAGNOSIS — R262 Difficulty in walking, not elsewhere classified: Secondary | ICD-10-CM | POA: Diagnosis not present

## 2017-10-04 DIAGNOSIS — M25672 Stiffness of left ankle, not elsewhere classified: Secondary | ICD-10-CM | POA: Diagnosis not present

## 2017-10-12 DIAGNOSIS — M25672 Stiffness of left ankle, not elsewhere classified: Secondary | ICD-10-CM | POA: Diagnosis not present

## 2017-10-12 DIAGNOSIS — M25472 Effusion, left ankle: Secondary | ICD-10-CM | POA: Diagnosis not present

## 2017-10-12 DIAGNOSIS — M25572 Pain in left ankle and joints of left foot: Secondary | ICD-10-CM | POA: Diagnosis not present

## 2017-10-12 DIAGNOSIS — R262 Difficulty in walking, not elsewhere classified: Secondary | ICD-10-CM | POA: Diagnosis not present

## 2017-10-14 ENCOUNTER — Telehealth (INDEPENDENT_AMBULATORY_CARE_PROVIDER_SITE_OTHER): Payer: Self-pay | Admitting: Orthopaedic Surgery

## 2017-10-14 NOTE — Telephone Encounter (Signed)
Per Dr Erlinda Hong no pre meds needed

## 2017-10-14 NOTE — Telephone Encounter (Signed)
Faxed order to them

## 2017-10-14 NOTE — Telephone Encounter (Signed)
No need for abx

## 2017-10-14 NOTE — Telephone Encounter (Signed)
Meghan from [facility] called stating that this patient is there now wanting to get a cleaning.  The patient surgery last fall and they need to know if the patient needs to be taking pre-medication before receiving dental service.  If so, they need it in writing and faxed to them at [phone].

## 2017-10-14 NOTE — Telephone Encounter (Signed)
See message below °

## 2017-10-18 DIAGNOSIS — M25472 Effusion, left ankle: Secondary | ICD-10-CM | POA: Diagnosis not present

## 2017-10-18 DIAGNOSIS — M25672 Stiffness of left ankle, not elsewhere classified: Secondary | ICD-10-CM | POA: Diagnosis not present

## 2017-10-18 DIAGNOSIS — R262 Difficulty in walking, not elsewhere classified: Secondary | ICD-10-CM | POA: Diagnosis not present

## 2017-10-18 DIAGNOSIS — M25572 Pain in left ankle and joints of left foot: Secondary | ICD-10-CM | POA: Diagnosis not present

## 2017-10-26 DIAGNOSIS — M25472 Effusion, left ankle: Secondary | ICD-10-CM | POA: Diagnosis not present

## 2017-10-26 DIAGNOSIS — M25672 Stiffness of left ankle, not elsewhere classified: Secondary | ICD-10-CM | POA: Diagnosis not present

## 2017-10-26 DIAGNOSIS — R262 Difficulty in walking, not elsewhere classified: Secondary | ICD-10-CM | POA: Diagnosis not present

## 2017-10-26 DIAGNOSIS — M25572 Pain in left ankle and joints of left foot: Secondary | ICD-10-CM | POA: Diagnosis not present

## 2017-11-01 ENCOUNTER — Other Ambulatory Visit (INDEPENDENT_AMBULATORY_CARE_PROVIDER_SITE_OTHER): Payer: Self-pay | Admitting: Orthopaedic Surgery

## 2017-11-01 ENCOUNTER — Telehealth (INDEPENDENT_AMBULATORY_CARE_PROVIDER_SITE_OTHER): Payer: Self-pay | Admitting: Orthopaedic Surgery

## 2017-11-01 ENCOUNTER — Other Ambulatory Visit (INDEPENDENT_AMBULATORY_CARE_PROVIDER_SITE_OTHER): Payer: Self-pay

## 2017-11-01 MED ORDER — TRAMADOL HCL 50 MG PO TABS: ORAL_TABLET | ORAL | 2 refills | Status: DC

## 2017-11-01 NOTE — Telephone Encounter (Signed)
Please advise 

## 2017-11-01 NOTE — Telephone Encounter (Signed)
RX Called into pharm. Patient did not answer, LMOM

## 2017-11-01 NOTE — Telephone Encounter (Signed)
Yes #30.  1-2 tab daily prn

## 2017-11-01 NOTE — Telephone Encounter (Signed)
Patient left voicemail requesting RX refill on Tramadol, she would like it sent to [facility] on [address]. Please call once sent in # [phone]

## 2017-11-02 DIAGNOSIS — R262 Difficulty in walking, not elsewhere classified: Secondary | ICD-10-CM | POA: Diagnosis not present

## 2017-11-02 DIAGNOSIS — M25572 Pain in left ankle and joints of left foot: Secondary | ICD-10-CM | POA: Diagnosis not present

## 2017-11-02 DIAGNOSIS — M25472 Effusion, left ankle: Secondary | ICD-10-CM | POA: Diagnosis not present

## 2017-11-02 DIAGNOSIS — M25672 Stiffness of left ankle, not elsewhere classified: Secondary | ICD-10-CM | POA: Diagnosis not present

## 2017-11-02 NOTE — Telephone Encounter (Signed)
Called into the pharm

## 2017-11-18 ENCOUNTER — Ambulatory Visit (INDEPENDENT_AMBULATORY_CARE_PROVIDER_SITE_OTHER): Payer: PPO

## 2017-11-18 ENCOUNTER — Ambulatory Visit (INDEPENDENT_AMBULATORY_CARE_PROVIDER_SITE_OTHER): Payer: PPO | Admitting: Orthopaedic Surgery

## 2017-11-18 ENCOUNTER — Encounter (INDEPENDENT_AMBULATORY_CARE_PROVIDER_SITE_OTHER): Payer: Self-pay | Admitting: Orthopaedic Surgery

## 2017-11-18 DIAGNOSIS — M25572 Pain in left ankle and joints of left foot: Secondary | ICD-10-CM | POA: Diagnosis not present

## 2017-11-18 DIAGNOSIS — S82252D Displaced comminuted fracture of shaft of left tibia, subsequent encounter for closed fracture with routine healing: Secondary | ICD-10-CM

## 2017-11-18 DIAGNOSIS — M25672 Stiffness of left ankle, not elsewhere classified: Secondary | ICD-10-CM | POA: Diagnosis not present

## 2017-11-18 DIAGNOSIS — R262 Difficulty in walking, not elsewhere classified: Secondary | ICD-10-CM | POA: Diagnosis not present

## 2017-11-18 DIAGNOSIS — M25472 Effusion, left ankle: Secondary | ICD-10-CM | POA: Diagnosis not present

## 2017-11-18 NOTE — Progress Notes (Signed)
Office Visit Note   Patient: [name]           Date of Birth: Jul 14, 1951           MRN: [id number] Visit Date: 11/18/2017              Requested by: Shon Baton, [place] [place], [place] [address] PCP: Shon Baton, MD   Assessment & Plan: Visit Diagnoses:  1. Closed displaced comminuted fracture of shaft of left tibia with routine healing, subsequent encounter   2. Pain in left ankle and joints of left foot     Plan: I discussed with the patient that the fracture does appear to be healing although slowly.  She does understand that the screws have broken in the interim but from a clinical standpoint she is healing her fracture.  She seems to be improving steadily.  I would like to continue to follow her fracture until it is fully healed.  I will see her back in 2 months with three-view x-rays of the left ankle.  Patient instructed to follow-up sooner if she develops any issues or has any concerns.  Follow-Up Instructions: Return in about 2 months (around 01/18/2018).   Orders:  Orders Placed This Encounter  Procedures  . XR Ankle 2 Views Left   No orders of the defined types were placed in this encounter.     Procedures: No procedures performed   Clinical Data: No additional findings.   Subjective: No chief complaint on file.   Patient is 5 months status post ORIF left tibia and medial malleolus fracture and lateral malleolus fracture.  She is improving with persistent swelling and some pain that is worse with activity.  She takes tramadol and Zanaflex at night.  Ibuprofen helps.  She does not go downstairs just fine.   Review of Systems  Constitutional: Negative.   HENT: Negative.   Eyes: Negative.   Respiratory: Negative.   Cardiovascular: Negative.   Endocrine: Negative.   Musculoskeletal: Negative.   Neurological: Negative.   Hematological: Negative.   Psychiatric/Behavioral: Negative.   All other systems reviewed and are  negative.    Objective: Vital Signs: There were no vitals taken for this visit.  Physical Exam  Constitutional: She is oriented to person, place, and time. She appears well-developed and well-nourished.  Pulmonary/Chest: Effort normal.  Neurological: She is alert and oriented to person, place, and time.  Skin: Skin is warm. Capillary refill takes less than 2 seconds.  Psychiatric: She has a normal mood and affect. Her behavior is normal. Judgment and thought content normal.  Nursing note and vitals reviewed.   Ortho Exam Left lower extremity exam shows fully healed surgical scars.  She does have persistent mild swelling of her lower extremity.  There is no warmth or signs of infection.  I can feel the proximal portion of the plate underneath the skin. Specialty Comments:  No specialty comments available.  Imaging: Xr Ankle 2 Views Left  Result Date: 11/18/2017 Interval breakage of the proximal 3 screws.  The proximal portion of the plate is slightly anterior compared to the previous x-ray.  There is abundant callus formation of the fibula and the tibia fracture compared to previous x-rays.  Overall appearance is consistent with healing and bony consolidation.    PMFS History: Patient Active Problem List   Diagnosis Date Noted  . Closed displaced comminuted fracture of shaft of left tibia 07/01/2017  . Bimalleolar ankle fracture, left, closed, initial encounter 07/01/2017  .  History of open reduction and internal fixation (ORIF) procedure 07/01/2017  . History of breast cancer 05/24/2013   Past Medical History:  Diagnosis Date  . Arthritis    "right hip; hands" (07/01/2017)  . Cancer of left breast [facility]) 08/2003   S/p left breast mastectomy  . Carpal tunnel syndrome, bilateral   . PONV (postoperative nausea and vomiting)     Family History  Problem Relation Age of Onset  . Alzheimer's disease Mother   . Seizures Mother     Past Surgical History:  Procedure Laterality  Date  . BREAST BIOPSY Left 2005  . CARPAL TUNNEL RELEASE Bilateral   . COLONOSCOPY    . FRACTURE SURGERY    . MASTECTOMY Left 10/14/2003  . MASTECTOMY WITH AXILLARY LYMPH NODE DISSECTION Left 10/14/2003  . OPEN REDUCTION INTERNAL FIXATION (ORIF) TIBIA/FIBULA FRACTURE Left 07/01/2017   OPEN REDUCTION INTERNAL FIXATION (ORIF) LEFT TIBIA/PILON FRACTURE/FIBULA FRACTURE   . ORIF ANKLE FRACTURE Left 07/01/2017   Procedure: OPEN REDUCTION INTERNAL FIXATION (ORIF) LEFT TIBIA/PILON FRACTURE/FIBULA FRACTURE;  Surgeon: Leandrew Koyanagi, MD;  Location: [place];  Service: Orthopedics;  Laterality: Left;  . RECONSTRUCTION BREAST W/ TRAM FLAP Left 10/14/2003   Social History   Occupational History  . Not on file  Tobacco Use  . Smoking status: Never Smoker  . Smokeless tobacco: Never Used  Substance and Sexual Activity  . Alcohol use: Yes    Alcohol/week: 3.6 oz    Types: 6 Glasses of wine per week  . Drug use: No  . Sexual activity: Yes    Birth control/protection: Post-menopausal

## 2017-12-28 ENCOUNTER — Other Ambulatory Visit: Payer: Self-pay | Admitting: Internal Medicine

## 2017-12-28 DIAGNOSIS — Z1231 Encounter for screening mammogram for malignant neoplasm of breast: Secondary | ICD-10-CM

## 2018-01-20 ENCOUNTER — Ambulatory Visit (INDEPENDENT_AMBULATORY_CARE_PROVIDER_SITE_OTHER): Payer: PPO | Admitting: Orthopaedic Surgery

## 2018-01-20 ENCOUNTER — Encounter (INDEPENDENT_AMBULATORY_CARE_PROVIDER_SITE_OTHER): Payer: Self-pay | Admitting: Orthopaedic Surgery

## 2018-01-20 ENCOUNTER — Ambulatory Visit (INDEPENDENT_AMBULATORY_CARE_PROVIDER_SITE_OTHER): Payer: PPO

## 2018-01-20 DIAGNOSIS — S82252D Displaced comminuted fracture of shaft of left tibia, subsequent encounter for closed fracture with routine healing: Secondary | ICD-10-CM | POA: Diagnosis not present

## 2018-01-20 DIAGNOSIS — S82842A Displaced bimalleolar fracture of left lower leg, initial encounter for closed fracture: Secondary | ICD-10-CM

## 2018-01-20 NOTE — Progress Notes (Signed)
Office Visit Note   Patient: [name]           Date of Birth: [birth year]           MRN: [id number] Visit Date: 01/20/2018              Requested by: Shon Baton, [address], [place] [address] PCP: Shon Baton, MD   Assessment & Plan: Visit Diagnoses:  1. Closed displaced comminuted fracture of shaft of left tibia with routine healing, subsequent encounter   2. Bimalleolar ankle fracture, left, closed, initial encounter     Plan: Impression is 7 months status post ORIF left tibia and ankle fracture.  She is improving slowly.  Overall from my standpoint she is doing much better than previous appointments.  Her x-rays show that she is healing.  At this point I like to see her back in another 2 months with three-view x-rays of the left ankle.  Follow-Up Instructions: Return in about 2 months (around 03/22/2018).   Orders:  Orders Placed This Encounter  Procedures  . XR Ankle Complete Left   No orders of the defined types were placed in this encounter.     Procedures: No procedures performed   Clinical Data: No additional findings.   Subjective: Chief Complaint  Patient presents with  . Left Ankle - Pain    Patient is 7 months status post ORIF left tibia fracture and bimalleolar ankle fracture.  Overall she is better.  She is more active.  She recently did a mission trip and lumbar.  She does still have persistent swelling in her leg.  She endorses that she has chronic mild achy pain in her leg.  Overall she is feeling better.   Review of Systems  Constitutional: Negative.   HENT: Negative.   Eyes: Negative.   Respiratory: Negative.   Cardiovascular: Negative.   Endocrine: Negative.   Musculoskeletal: Negative.   Neurological: Negative.   Hematological: Negative.   Psychiatric/Behavioral: Negative.   All other systems reviewed and are negative.    Objective: Vital Signs: There were no vitals taken for this visit.  Physical Exam    Constitutional: She is oriented to person, place, and time. She appears well-developed and well-nourished.  Pulmonary/Chest: Effort normal.  Neurological: She is alert and oriented to person, place, and time.  Skin: Skin is warm. Capillary refill takes less than 2 seconds.  Psychiatric: She has a normal mood and affect. Her behavior is normal. Judgment and thought content normal.  Nursing note and vitals reviewed.   Ortho Exam Left leg exam shows fully healed surgical scars.  She does have 1+ pitting edema.  Foot is warm well perfused.  She is ambulating with a very mild limp. Specialty Comments:  No specialty comments available.  Imaging: Xr Ankle Complete Left  Result Date: 01/20/2018 X-rays demonstrate increasing healing and bridging bony consolidation and callus formation.  Stable hardware.  No worsening.     PMFS History: Patient Active Problem List   Diagnosis Date Noted  . Closed displaced comminuted fracture of shaft of left tibia 07/01/2017  . Bimalleolar ankle fracture, left, closed, initial encounter 07/01/2017  . History of open reduction and internal fixation (ORIF) procedure 07/01/2017  . History of breast cancer 05/24/2013   Past Medical History:  Diagnosis Date  . Arthritis    "right hip; hands" (07/01/2017)  . Cancer of left breast [facility]) 08/2003   S/p left breast mastectomy  . Carpal tunnel syndrome, bilateral   .  PONV (postoperative nausea and vomiting)     Family History  Problem Relation Age of Onset  . Alzheimer's disease Mother   . Seizures Mother     Past Surgical History:  Procedure Laterality Date  . BREAST BIOPSY Left 2005  . CARPAL TUNNEL RELEASE Bilateral   . COLONOSCOPY    . FRACTURE SURGERY    . MASTECTOMY Left 10/14/2003  . MASTECTOMY WITH AXILLARY LYMPH NODE DISSECTION Left 10/14/2003  . OPEN REDUCTION INTERNAL FIXATION (ORIF) TIBIA/FIBULA FRACTURE Left 07/01/2017   OPEN REDUCTION INTERNAL FIXATION (ORIF) LEFT TIBIA/PILON  FRACTURE/FIBULA FRACTURE   . ORIF ANKLE FRACTURE Left 07/01/2017   Procedure: OPEN REDUCTION INTERNAL FIXATION (ORIF) LEFT TIBIA/PILON FRACTURE/FIBULA FRACTURE;  Surgeon: Leandrew Koyanagi, MD;  Location: [place];  Service: Orthopedics;  Laterality: Left;  . RECONSTRUCTION BREAST W/ TRAM FLAP Left 10/14/2003   Social History   Occupational History  . Not on file  Tobacco Use  . Smoking status: Never Smoker  . Smokeless tobacco: Never Used  Substance and Sexual Activity  . Alcohol use: Yes    Alcohol/week: 3.6 oz    Types: 6 Glasses of wine per week  . Drug use: No  . Sexual activity: Yes    Birth control/protection: Post-menopausal

## 2018-02-08 ENCOUNTER — Ambulatory Visit
Admission: RE | Admit: 2018-02-08 | Discharge: 2018-02-08 | Disposition: A | Discharge status: Home / Self Care | Payer: PPO | Source: Ambulatory Visit | Attending: Internal Medicine | Admitting: Internal Medicine

## 2018-02-08 DIAGNOSIS — Z1231 Encounter for screening mammogram for malignant neoplasm of breast: Secondary | ICD-10-CM

## 2018-03-22 ENCOUNTER — Ambulatory Visit (INDEPENDENT_AMBULATORY_CARE_PROVIDER_SITE_OTHER): Payer: PPO | Admitting: Orthopaedic Surgery

## 2018-03-23 ENCOUNTER — Ambulatory Visit (INDEPENDENT_AMBULATORY_CARE_PROVIDER_SITE_OTHER): Payer: PPO

## 2018-03-23 ENCOUNTER — Encounter (INDEPENDENT_AMBULATORY_CARE_PROVIDER_SITE_OTHER): Payer: Self-pay | Admitting: Orthopaedic Surgery

## 2018-03-23 ENCOUNTER — Ambulatory Visit (INDEPENDENT_AMBULATORY_CARE_PROVIDER_SITE_OTHER): Payer: PPO | Admitting: Orthopaedic Surgery

## 2018-03-23 DIAGNOSIS — S82252D Displaced comminuted fracture of shaft of left tibia, subsequent encounter for closed fracture with routine healing: Secondary | ICD-10-CM

## 2018-03-23 DIAGNOSIS — S82872A Displaced pilon fracture of left tibia, initial encounter for closed fracture: Secondary | ICD-10-CM

## 2018-03-23 NOTE — Progress Notes (Signed)
Office Visit Note   Patient: [name]           Date of Birth: [birth year]           MRN: [id number] Visit Date: 03/23/2018              Requested by: Shon Baton, [address] [place], [place] [address] PCP: Shon Baton, MD   Assessment & Plan: Visit Diagnoses:  1. Closed displaced pilon fracture of left tibia, initial encounter   2. Closed displaced comminuted fracture of shaft of left tibia with routine healing, subsequent encounter     Plan: Impression is 9 months status post ORIF left tibia-fibula.  She is now demonstrating healing.  Clinically she is doing very well.  She is having discomfort and pain from the proximal end of the plate which is very prominent under the skin.  She is interested in plate removal in the future.  All questions answered to their satisfaction today.  She will make a follow-up appointment later this year to discuss removal of the plate.  She will need three-view x-rays of the left ankle at that time.  Follow-Up Instructions: Return if symptoms worsen or fail to improve.   Orders:  Orders Placed This Encounter  Procedures  . XR Ankle Complete Left   No orders of the defined types were placed in this encounter.     Procedures: No procedures performed   Clinical Data: No additional findings.   Subjective: Chief Complaint  Patient presents with  . Left Ankle - Pain, Follow-up    Patient is a 67 year old female who is 9 months status post ORIF left tibia and fibula fractures.  She follows up today for routine follow-up.  She states that overall she is doing better.  She has some mild aching most of the time that is worse with activity but she rarely takes any medicines for this.  Overall she is doing well.    Review of Systems  Constitutional: Negative.   HENT: Negative.   Eyes: Negative.   Respiratory: Negative.   Cardiovascular: Negative.   Endocrine: Negative.   Musculoskeletal: Negative.   Neurological: Negative.     Hematological: Negative.   Psychiatric/Behavioral: Negative.   All other systems reviewed and are negative.    Objective: Vital Signs: There were no vitals taken for this visit.  Physical Exam  Constitutional: She is oriented to person, place, and time. She appears well-developed and well-nourished.  Pulmonary/Chest: Effort normal.  Neurological: She is alert and oriented to person, place, and time.  Skin: Skin is warm. Capillary refill takes less than 2 seconds.  Psychiatric: She has a normal mood and affect. Her behavior is normal. Judgment and thought content normal.  Nursing note and vitals reviewed.   Ortho Exam Swelling has significantly improved she has minimal swelling.  Fully healed surgical scars.  She is walking essentially normally now. Specialty Comments:  No specialty comments available.  Imaging: Xr Ankle Complete Left  Result Date: 03/23/2018 X-rays demonstrate abundant callus formation and bony consolidation of the fractures consistent with healing.  Stable hardware    PMFS History: Patient Active Problem List   Diagnosis Date Noted  . Closed displaced comminuted fracture of shaft of left tibia 07/01/2017  . Bimalleolar ankle fracture, left, closed, initial encounter 07/01/2017  . History of open reduction and internal fixation (ORIF) procedure 07/01/2017  . History of breast cancer 05/24/2013   Past Medical History:  Diagnosis Date  . Arthritis    "  right hip; hands" (07/01/2017)  . Cancer of left breast [facility]) 08/2003   S/p left breast mastectomy  . Carpal tunnel syndrome, bilateral   . PONV (postoperative nausea and vomiting)     Family History  Problem Relation Age of Onset  . Alzheimer's disease Mother   . Seizures Mother     Past Surgical History:  Procedure Laterality Date  . BREAST BIOPSY Left 2005  . CARPAL TUNNEL RELEASE Bilateral   . COLONOSCOPY    . FRACTURE SURGERY    . MASTECTOMY Left 10/14/2003  . MASTECTOMY WITH AXILLARY LYMPH  NODE DISSECTION Left 10/14/2003  . OPEN REDUCTION INTERNAL FIXATION (ORIF) TIBIA/FIBULA FRACTURE Left 07/01/2017   OPEN REDUCTION INTERNAL FIXATION (ORIF) LEFT TIBIA/PILON FRACTURE/FIBULA FRACTURE   . ORIF ANKLE FRACTURE Left 07/01/2017   Procedure: OPEN REDUCTION INTERNAL FIXATION (ORIF) LEFT TIBIA/PILON FRACTURE/FIBULA FRACTURE;  Surgeon: Leandrew Koyanagi, MD;  Location: [place];  Service: Orthopedics;  Laterality: Left;  . RECONSTRUCTION BREAST W/ TRAM FLAP Left 10/14/2003   Social History   Occupational History  . Not on file  Tobacco Use  . Smoking status: Never Smoker  . Smokeless tobacco: Never Used  Substance and Sexual Activity  . Alcohol use: Yes    Alcohol/week: 3.6 oz    Types: 6 Glasses of wine per week  . Drug use: No  . Sexual activity: Yes    Birth control/protection: Post-menopausal

## 2018-03-27 DIAGNOSIS — E7849 Other hyperlipidemia: Secondary | ICD-10-CM | POA: Diagnosis not present

## 2018-03-27 DIAGNOSIS — E559 Vitamin D deficiency, unspecified: Secondary | ICD-10-CM | POA: Diagnosis not present

## 2018-03-27 DIAGNOSIS — R82998 Other abnormal findings in urine: Secondary | ICD-10-CM | POA: Diagnosis not present

## 2018-04-03 DIAGNOSIS — M1990 Unspecified osteoarthritis, unspecified site: Secondary | ICD-10-CM | POA: Diagnosis not present

## 2018-04-03 DIAGNOSIS — E7849 Other hyperlipidemia: Secondary | ICD-10-CM | POA: Diagnosis not present

## 2018-04-03 DIAGNOSIS — Z6829 Body mass index (BMI) 29.0-29.9, adult: Secondary | ICD-10-CM | POA: Diagnosis not present

## 2018-04-03 DIAGNOSIS — Z1389 Encounter for screening for other disorder: Secondary | ICD-10-CM | POA: Diagnosis not present

## 2018-04-03 DIAGNOSIS — Z1151 Encounter for screening for human papillomavirus (HPV): Secondary | ICD-10-CM | POA: Diagnosis not present

## 2018-04-03 DIAGNOSIS — L723 Sebaceous cyst: Secondary | ICD-10-CM | POA: Diagnosis not present

## 2018-04-03 DIAGNOSIS — N951 Menopausal and female climacteric states: Secondary | ICD-10-CM | POA: Diagnosis not present

## 2018-04-03 DIAGNOSIS — M859 Disorder of bone density and structure, unspecified: Secondary | ICD-10-CM | POA: Diagnosis not present

## 2018-04-03 DIAGNOSIS — Z23 Encounter for immunization: Secondary | ICD-10-CM | POA: Diagnosis not present

## 2018-04-03 DIAGNOSIS — R05 Cough: Secondary | ICD-10-CM | POA: Diagnosis not present

## 2018-04-03 DIAGNOSIS — E559 Vitamin D deficiency, unspecified: Secondary | ICD-10-CM | POA: Diagnosis not present

## 2018-04-03 DIAGNOSIS — Z853 Personal history of malignant neoplasm of breast: Secondary | ICD-10-CM | POA: Diagnosis not present

## 2018-04-03 DIAGNOSIS — Z0000 Encounter for general adult medical examination without abnormal findings: Secondary | ICD-10-CM | POA: Diagnosis not present

## 2018-04-06 DIAGNOSIS — Z1212 Encounter for screening for malignant neoplasm of rectum: Secondary | ICD-10-CM | POA: Diagnosis not present

## 2018-07-14 DIAGNOSIS — L821 Other seborrheic keratosis: Secondary | ICD-10-CM | POA: Diagnosis not present

## 2018-07-14 DIAGNOSIS — L820 Inflamed seborrheic keratosis: Secondary | ICD-10-CM | POA: Diagnosis not present

## 2018-07-14 DIAGNOSIS — D1801 Hemangioma of skin and subcutaneous tissue: Secondary | ICD-10-CM | POA: Diagnosis not present

## 2018-07-14 DIAGNOSIS — D225 Melanocytic nevi of trunk: Secondary | ICD-10-CM | POA: Diagnosis not present

## 2018-07-14 DIAGNOSIS — L814 Other melanin hyperpigmentation: Secondary | ICD-10-CM | POA: Diagnosis not present

## 2018-08-18 ENCOUNTER — Ambulatory Visit (INDEPENDENT_AMBULATORY_CARE_PROVIDER_SITE_OTHER): Payer: PPO | Admitting: Orthopaedic Surgery

## 2018-08-18 ENCOUNTER — Ambulatory Visit (INDEPENDENT_AMBULATORY_CARE_PROVIDER_SITE_OTHER): Payer: PPO

## 2018-08-18 ENCOUNTER — Encounter (INDEPENDENT_AMBULATORY_CARE_PROVIDER_SITE_OTHER): Payer: Self-pay | Admitting: Orthopaedic Surgery

## 2018-08-18 DIAGNOSIS — S82872A Displaced pilon fracture of left tibia, initial encounter for closed fracture: Secondary | ICD-10-CM | POA: Diagnosis not present

## 2018-08-18 DIAGNOSIS — S82252A Displaced comminuted fracture of shaft of left tibia, initial encounter for closed fracture: Secondary | ICD-10-CM | POA: Diagnosis not present

## 2018-08-18 DIAGNOSIS — S82252D Displaced comminuted fracture of shaft of left tibia, subsequent encounter for closed fracture with routine healing: Secondary | ICD-10-CM

## 2018-08-18 NOTE — Progress Notes (Signed)
Office Visit Note   Patient: [name]           Date of Birth: 15-Mar-1951           MRN: [id number] Visit Date: 08/18/2018              Requested by: [name], [address] [place], [place] [address] PCP: Carmen Baton, MD   Assessment & Plan: Visit Diagnoses:  1. Closed displaced pilon fracture of left tibia, initial encounter   2. Closed displaced comminuted fracture of shaft of left tibia with routine healing, subsequent encounter   3. Closed displaced comminuted fracture of shaft of left tibia, initial encounter     Plan: She is doing well from overall healing and recovery standpoint.  At this point we will schedule removal of all of the hardware because she is symptomatic from this.  She has demonstrated full bony consolidation and healing.  We will schedule this for the end of January per her request.  Follow-Up Instructions: Return for 2 week postop visit.   Orders:  Orders Placed This Encounter  Procedures  . XR Tibia/Fibula Left   No orders of the defined types were placed in this encounter.     Procedures: No procedures performed   Clinical Data: No additional findings.   Subjective: Chief Complaint  Patient presents with  . Left Leg - Pain, Follow-up    [name] is 14 months status post surgical fixation of a tibial plafond fracture.  She is overall doing well.  Clinically she is doing well.  She is not taking any medicines on a regular basis.  She does have some aching discomfort but it is much better than it was.  She is here to discuss hardware removal.  The medial plate is really bothering her right and is just underneath the skin.   Review of Systems   Objective: Vital Signs: There were no vitals taken for this visit.  Physical Exam  Ortho Exam Left tib-fib exam shows fully healed surgical scars.  The plate is prominent underneath the skin on the anteromedial side.  Her swelling has significantly improved. Specialty Comments:  No  specialty comments available.  Imaging: Xr Tibia/fibula Left  Result Date: 08/18/2018 Fully healed tibia and fibula fractures.  Hardware is stable.    PMFS History: Patient Active Problem List   Diagnosis Date Noted  . Closed displaced comminuted fracture of shaft of left tibia 07/01/2017  . Bimalleolar ankle fracture, left, closed, initial encounter 07/01/2017  . History of open reduction and internal fixation (ORIF) procedure 07/01/2017  . History of breast cancer 05/24/2013   Past Medical History:  Diagnosis Date  . Arthritis    "right hip; hands" (07/01/2017)  . Cancer of left breast [facility]) 08/2003   S/p left breast mastectomy  . Carpal tunnel syndrome, bilateral   . PONV (postoperative nausea and vomiting)     Family History  Problem Relation Age of Onset  . Alzheimer's disease Mother   . Seizures Mother     Past Surgical History:  Procedure Laterality Date  . BREAST BIOPSY Left 2005  . CARPAL TUNNEL RELEASE Bilateral   . COLONOSCOPY    . FRACTURE SURGERY    . MASTECTOMY Left 10/14/2003  . MASTECTOMY WITH AXILLARY LYMPH NODE DISSECTION Left 10/14/2003  . OPEN REDUCTION INTERNAL FIXATION (ORIF) TIBIA/FIBULA FRACTURE Left 07/01/2017   OPEN REDUCTION INTERNAL FIXATION (ORIF) LEFT TIBIA/PILON FRACTURE/FIBULA FRACTURE   . ORIF ANKLE FRACTURE Left 07/01/2017   Procedure:  OPEN REDUCTION INTERNAL FIXATION (ORIF) LEFT TIBIA/PILON FRACTURE/FIBULA FRACTURE;  Surgeon: Leandrew Koyanagi, MD;  Location: [place];  Service: Orthopedics;  Laterality: Left;  . RECONSTRUCTION BREAST W/ TRAM FLAP Left 10/14/2003   Social History   Occupational History  . Not on file  Tobacco Use  . Smoking status: Never Smoker  . Smokeless tobacco: Never Used  Substance and Sexual Activity  . Alcohol use: Yes    Alcohol/week: 6.0 standard drinks    Types: 6 Glasses of wine per week  . Drug use: No  . Sexual activity: Yes    Birth control/protection: Post-menopausal

## 2018-09-28 ENCOUNTER — Encounter (INDEPENDENT_AMBULATORY_CARE_PROVIDER_SITE_OTHER): Payer: Self-pay | Admitting: Orthopaedic Surgery

## 2018-09-28 ENCOUNTER — Other Ambulatory Visit (INDEPENDENT_AMBULATORY_CARE_PROVIDER_SITE_OTHER): Payer: Self-pay | Admitting: Physician Assistant

## 2018-09-28 DIAGNOSIS — T8484XA Pain due to internal orthopedic prosthetic devices, implants and grafts, initial encounter: Secondary | ICD-10-CM | POA: Diagnosis not present

## 2018-09-28 MED ORDER — HYDROCODONE-ACETAMINOPHEN 5-325 MG PO TABS: 1.0000 | ORAL_TABLET | Freq: Four times a day (QID) | ORAL | 0 refills | Status: DC | PRN

## 2018-09-29 ENCOUNTER — Telehealth (INDEPENDENT_AMBULATORY_CARE_PROVIDER_SITE_OTHER): Payer: Self-pay | Admitting: Orthopaedic Surgery

## 2018-09-29 NOTE — Telephone Encounter (Signed)
Returned call to patient left message to call back to schedule 1 wk post visit with Dr. Erlinda Hong [phone]

## 2018-10-03 DIAGNOSIS — L820 Inflamed seborrheic keratosis: Secondary | ICD-10-CM | POA: Diagnosis not present

## 2018-10-05 ENCOUNTER — Ambulatory Visit (INDEPENDENT_AMBULATORY_CARE_PROVIDER_SITE_OTHER): Payer: PPO | Admitting: Orthopaedic Surgery

## 2018-10-05 ENCOUNTER — Encounter (INDEPENDENT_AMBULATORY_CARE_PROVIDER_SITE_OTHER): Payer: Self-pay | Admitting: Orthopaedic Surgery

## 2018-10-05 VITALS — Ht 67.0 in | Wt 185.0 lb

## 2018-10-05 DIAGNOSIS — T8484XA Pain due to internal orthopedic prosthetic devices, implants and grafts, initial encounter: Secondary | ICD-10-CM

## 2018-10-05 NOTE — Progress Notes (Signed)
   Post-Op Visit Note from   Patient: [name]           Date of Birth: 03/01/1951           MRN: [id number] Visit Date: 10/05/2018 PCP: Shon Baton, MD   Assessment & Plan:  Chief Complaint:  Chief Complaint  Patient presents with  . Left Leg - Routine Post Op    09/28/2018 Hardware removal left tibia/fibula   Visit Diagnoses:  1. Painful orthopaedic hardware [facility])     Plan: Patient is 1 week status post hardware removal from her left tibia and left fibula.  She is doing well and walking with a normal shoe.  She reports some achy discomfort around the incisions but no pain in her tibia.  Overall she is doing well.  She is not taking any pain medicines.  She has expected moderate postoperative swelling.  No evidence of infection at the surgical incisions.  I recommend Bactroban ointment to the incisions daily.  Follow-up next week for suture removal.  Follow-Up Instructions: Return for As scheduled.   Orders:  No orders of the defined types were placed in this encounter.  No orders of the defined types were placed in this encounter.   Imaging: No results found.  PMFS History: Patient Active Problem List   Diagnosis Date Noted  . Painful orthopaedic hardware ([place]) 09/28/2018  . Closed displaced comminuted fracture of shaft of left tibia 07/01/2017  . Bimalleolar ankle fracture, left, closed, initial encounter 07/01/2017  . History of open reduction and internal fixation (ORIF) procedure 07/01/2017  . History of breast cancer 05/24/2013   Past Medical History:  Diagnosis Date  . Arthritis    "right hip; hands" (07/01/2017)  . Cancer of left breast [facility]) 08/2003   S/p left breast mastectomy  . Carpal tunnel syndrome, bilateral   . PONV (postoperative nausea and vomiting)     Family History  Problem Relation Age of Onset  . Alzheimer's disease Mother   . Seizures Mother     Past Surgical History:  Procedure Laterality Date  . BREAST BIOPSY Left 2005  . CARPAL  TUNNEL RELEASE Bilateral   . COLONOSCOPY    . FRACTURE SURGERY    . MASTECTOMY Left 10/14/2003  . MASTECTOMY WITH AXILLARY LYMPH NODE DISSECTION Left 10/14/2003  . OPEN REDUCTION INTERNAL FIXATION (ORIF) TIBIA/FIBULA FRACTURE Left 07/01/2017   OPEN REDUCTION INTERNAL FIXATION (ORIF) LEFT TIBIA/PILON FRACTURE/FIBULA FRACTURE   . ORIF ANKLE FRACTURE Left 07/01/2017   Procedure: OPEN REDUCTION INTERNAL FIXATION (ORIF) LEFT TIBIA/PILON FRACTURE/FIBULA FRACTURE;  Surgeon: Leandrew Koyanagi, MD;  Location: [place];  Service: Orthopedics;  Laterality: Left;  . RECONSTRUCTION BREAST W/ TRAM FLAP Left 10/14/2003   Social History   Occupational History  . Not on file  Tobacco Use  . Smoking status: Never Smoker  . Smokeless tobacco: Never Used  Substance and Sexual Activity  . Alcohol use: Yes    Alcohol/week: 6.0 standard drinks    Types: 6 Glasses of wine per week  . Drug use: No  . Sexual activity: Yes    Birth control/protection: Post-menopausal

## 2018-10-12 ENCOUNTER — Ambulatory Visit (INDEPENDENT_AMBULATORY_CARE_PROVIDER_SITE_OTHER): Payer: PPO | Admitting: Orthopaedic Surgery

## 2018-10-12 ENCOUNTER — Encounter (INDEPENDENT_AMBULATORY_CARE_PROVIDER_SITE_OTHER): Payer: Self-pay | Admitting: Orthopaedic Surgery

## 2018-10-12 DIAGNOSIS — T8484XA Pain due to internal orthopedic prosthetic devices, implants and grafts, initial encounter: Secondary | ICD-10-CM

## 2018-10-12 NOTE — Progress Notes (Signed)
   Post-Op Visit Note   Patient: [name]           Date of Birth: [birth year]           MRN: [id number] Visit Date: 10/12/2018 PCP: Shon Baton, MD   Assessment & Plan:  Chief Complaint:  Chief Complaint  Patient presents with  . Left Ankle - Pain   Visit Diagnoses:  1. Painful orthopaedic hardware [facility])     Plan: Kymia is two-week status post hardware removal from the tibia and fibula.  She is overall doing okay.  Pain is improving.  Swelling is improving.  The surgical incisions are fully healed.  We remove the sutures today and placed 2 strips.  She may continue with light activity as prescribed and discussed.  We will see her back in 6 weeks with three-view x-rays of the left ankle.  Anticipate allowing her to do perform full activity after that.  Follow-Up Instructions: Return in about 6 weeks (around 11/23/2018).   Orders:  No orders of the defined types were placed in this encounter.  No orders of the defined types were placed in this encounter.   Imaging: No results found.  PMFS History: Patient Active Problem List   Diagnosis Date Noted  . Painful orthopaedic hardware ([place]) 09/28/2018  . Closed displaced comminuted fracture of shaft of left tibia 07/01/2017  . Bimalleolar ankle fracture, left, closed, initial encounter 07/01/2017  . History of open reduction and internal fixation (ORIF) procedure 07/01/2017  . History of breast cancer 05/24/2013   Past Medical History:  Diagnosis Date  . Arthritis    "right hip; hands" (07/01/2017)  . Cancer of left breast [facility]) 08/2003   S/p left breast mastectomy  . Carpal tunnel syndrome, bilateral   . PONV (postoperative nausea and vomiting)     Family History  Problem Relation Age of Onset  . Alzheimer's disease Mother   . Seizures Mother     Past Surgical History:  Procedure Laterality Date  . BREAST BIOPSY Left 2005  . CARPAL TUNNEL RELEASE Bilateral   . COLONOSCOPY    . FRACTURE SURGERY    . MASTECTOMY  Left 10/14/2003  . MASTECTOMY WITH AXILLARY LYMPH NODE DISSECTION Left 10/14/2003  . OPEN REDUCTION INTERNAL FIXATION (ORIF) TIBIA/FIBULA FRACTURE Left 07/01/2017   OPEN REDUCTION INTERNAL FIXATION (ORIF) LEFT TIBIA/PILON FRACTURE/FIBULA FRACTURE   . ORIF ANKLE FRACTURE Left 07/01/2017   Procedure: OPEN REDUCTION INTERNAL FIXATION (ORIF) LEFT TIBIA/PILON FRACTURE/FIBULA FRACTURE;  Surgeon: Leandrew Koyanagi, MD;  Location: [place];  Service: Orthopedics;  Laterality: Left;  . RECONSTRUCTION BREAST W/ TRAM FLAP Left 10/14/2003   Social History   Occupational History  . Not on file  Tobacco Use  . Smoking status: Never Smoker  . Smokeless tobacco: Never Used  Substance and Sexual Activity  . Alcohol use: Yes    Alcohol/week: 6.0 standard drinks    Types: 6 Glasses of wine per week  . Drug use: No  . Sexual activity: Yes    Birth control/protection: Post-menopausal

## 2018-11-22 ENCOUNTER — Telehealth (INDEPENDENT_AMBULATORY_CARE_PROVIDER_SITE_OTHER): Payer: Self-pay

## 2018-11-22 NOTE — Telephone Encounter (Signed)
Patient answered not to all questions except she did go to [place] last week to her own personal house with her family for several days.

## 2018-11-23 ENCOUNTER — Encounter (INDEPENDENT_AMBULATORY_CARE_PROVIDER_SITE_OTHER): Payer: Self-pay | Admitting: Orthopaedic Surgery

## 2018-11-23 ENCOUNTER — Ambulatory Visit (INDEPENDENT_AMBULATORY_CARE_PROVIDER_SITE_OTHER): Payer: PPO

## 2018-11-23 ENCOUNTER — Other Ambulatory Visit: Payer: Self-pay

## 2018-11-23 ENCOUNTER — Ambulatory Visit (INDEPENDENT_AMBULATORY_CARE_PROVIDER_SITE_OTHER): Payer: PPO | Admitting: Orthopaedic Surgery

## 2018-11-23 DIAGNOSIS — S82872A Displaced pilon fracture of left tibia, initial encounter for closed fracture: Secondary | ICD-10-CM

## 2018-11-23 DIAGNOSIS — Z8781 Personal history of (healed) traumatic fracture: Secondary | ICD-10-CM

## 2018-11-23 NOTE — Progress Notes (Signed)
Office Visit Note   Patient: [name]           Date of Birth: 02/27/1951           MRN: [id number] Visit Date: 11/23/2018              Requested by: Shon Baton, [address], [place] [address] PCP: Shon Baton, MD   Assessment & Plan: Visit Diagnoses:  1. Closed displaced pilon fracture of left tibia, initial encounter     Plan: At this point patient has done well and is released to activity as tolerated.  Questions encouraged and answered.  Follow-up as needed.  Follow-Up Instructions: Return if symptoms worsen or fail to improve.   Orders:  Orders Placed This Encounter  Procedures  . XR Ankle Complete Left   No orders of the defined types were placed in this encounter.     Procedures: No procedures performed   Clinical Data: No additional findings.   Subjective: Chief Complaint  Patient presents with  . Left Ankle - Pain    Ms. [name] is 8 weeks status post hardware removal.  She is doing well and has no real complaints today.  She has been as active as she wants to be.   Review of Systems  Constitutional: Negative.   HENT: Negative.   Eyes: Negative.   Respiratory: Negative.   Cardiovascular: Negative.   Endocrine: Negative.   Musculoskeletal: Negative.   Neurological: Negative.   Hematological: Negative.   Psychiatric/Behavioral: Negative.   All other systems reviewed and are negative.    Objective: Vital Signs: There were no vitals taken for this visit.  Physical Exam Vitals signs and nursing note reviewed.  Constitutional:      Appearance: She is well-developed.  Pulmonary:     Effort: Pulmonary effort is normal.  Skin:    General: Skin is warm.     Capillary Refill: Capillary refill takes less than 2 seconds.  Neurological:     Mental Status: She is alert and oriented to person, place, and time.  Psychiatric:        Behavior: Behavior normal.        Thought Content: Thought content normal.        Judgment:  Judgment normal.     Ortho Exam Surgical scars are fully healed.  She is slightly tender over the lateral aspect of the distal fibula.  The suture knot was pulled today.  No evidence of suture abscess or infection. Specialty Comments:  No specialty comments available.  Imaging: Xr Ankle Complete Left  Result Date: 11/23/2018 Fully healed tibia fracture.  The screw holes appear to be filling in.  No complications.    PMFS History: Patient Active Problem List   Diagnosis Date Noted  . Painful orthopaedic hardware ([place]) 09/28/2018  . Closed displaced comminuted fracture of shaft of left tibia 07/01/2017  . Bimalleolar ankle fracture, left, closed, initial encounter 07/01/2017  . History of open reduction and internal fixation (ORIF) procedure 07/01/2017  . History of breast cancer 05/24/2013   Past Medical History:  Diagnosis Date  . Arthritis    "right hip; hands" (07/01/2017)  . Cancer of left breast [facility]) 08/2003   S/p left breast mastectomy  . Carpal tunnel syndrome, bilateral   . PONV (postoperative nausea and vomiting)     Family History  Problem Relation Age of Onset  . Alzheimer's disease Mother   . Seizures Mother     Past Surgical History:  Procedure Laterality Date  . BREAST BIOPSY Left 2005  . CARPAL TUNNEL RELEASE Bilateral   . COLONOSCOPY    . FRACTURE SURGERY    . MASTECTOMY Left 10/14/2003  . MASTECTOMY WITH AXILLARY LYMPH NODE DISSECTION Left 10/14/2003  . OPEN REDUCTION INTERNAL FIXATION (ORIF) TIBIA/FIBULA FRACTURE Left 07/01/2017   OPEN REDUCTION INTERNAL FIXATION (ORIF) LEFT TIBIA/PILON FRACTURE/FIBULA FRACTURE   . ORIF ANKLE FRACTURE Left 07/01/2017   Procedure: OPEN REDUCTION INTERNAL FIXATION (ORIF) LEFT TIBIA/PILON FRACTURE/FIBULA FRACTURE;  Surgeon: Leandrew Koyanagi, MD;  Location: [place];  Service: Orthopedics;  Laterality: Left;  . RECONSTRUCTION BREAST W/ TRAM FLAP Left 10/14/2003   Social History   Occupational History  . Not on file   Tobacco Use  . Smoking status: Never Smoker  . Smokeless tobacco: Never Used  Substance and Sexual Activity  . Alcohol use: Yes    Alcohol/week: 6.0 standard drinks    Types: 6 Glasses of wine per week  . Drug use: No  . Sexual activity: Yes    Birth control/protection: Post-menopausal

## 2019-01-10 ENCOUNTER — Other Ambulatory Visit: Payer: Self-pay | Admitting: Internal Medicine

## 2019-01-10 DIAGNOSIS — Z1231 Encounter for screening mammogram for malignant neoplasm of breast: Secondary | ICD-10-CM

## 2019-02-13 DIAGNOSIS — L820 Inflamed seborrheic keratosis: Secondary | ICD-10-CM | POA: Diagnosis not present

## 2019-02-13 DIAGNOSIS — D225 Melanocytic nevi of trunk: Secondary | ICD-10-CM | POA: Diagnosis not present

## 2019-02-13 DIAGNOSIS — D2370 Other benign neoplasm of skin of unspecified lower limb, including hip: Secondary | ICD-10-CM | POA: Diagnosis not present

## 2019-03-06 ENCOUNTER — Ambulatory Visit
Admission: RE | Admit: 2019-03-06 | Discharge: 2019-03-06 | Disposition: A | Discharge status: Home / Self Care | Payer: PPO | Source: Ambulatory Visit | Attending: Internal Medicine | Admitting: Internal Medicine

## 2019-03-06 ENCOUNTER — Other Ambulatory Visit: Payer: Self-pay

## 2019-03-06 DIAGNOSIS — Z1231 Encounter for screening mammogram for malignant neoplasm of breast: Secondary | ICD-10-CM | POA: Diagnosis not present

## 2019-04-26 DIAGNOSIS — Z23 Encounter for immunization: Secondary | ICD-10-CM | POA: Diagnosis not present

## 2019-05-28 DIAGNOSIS — E559 Vitamin D deficiency, unspecified: Secondary | ICD-10-CM | POA: Diagnosis not present

## 2019-05-28 DIAGNOSIS — R82998 Other abnormal findings in urine: Secondary | ICD-10-CM | POA: Diagnosis not present

## 2019-05-28 DIAGNOSIS — E7849 Other hyperlipidemia: Secondary | ICD-10-CM | POA: Diagnosis not present

## 2019-05-30 DIAGNOSIS — Z1212 Encounter for screening for malignant neoplasm of rectum: Secondary | ICD-10-CM | POA: Diagnosis not present

## 2019-06-04 DIAGNOSIS — E785 Hyperlipidemia, unspecified: Secondary | ICD-10-CM | POA: Diagnosis not present

## 2019-06-04 DIAGNOSIS — Z0000 Encounter for general adult medical examination without abnormal findings: Secondary | ICD-10-CM | POA: Diagnosis not present

## 2019-06-04 DIAGNOSIS — E559 Vitamin D deficiency, unspecified: Secondary | ICD-10-CM | POA: Diagnosis not present

## 2019-06-04 DIAGNOSIS — M1990 Unspecified osteoarthritis, unspecified site: Secondary | ICD-10-CM | POA: Diagnosis not present

## 2019-06-04 DIAGNOSIS — Z853 Personal history of malignant neoplasm of breast: Secondary | ICD-10-CM | POA: Diagnosis not present

## 2019-06-04 DIAGNOSIS — M8580 Other specified disorders of bone density and structure, unspecified site: Secondary | ICD-10-CM | POA: Diagnosis not present

## 2019-07-19 DIAGNOSIS — H524 Presbyopia: Secondary | ICD-10-CM | POA: Diagnosis not present

## 2019-07-19 DIAGNOSIS — H5201 Hypermetropia, right eye: Secondary | ICD-10-CM | POA: Diagnosis not present

## 2019-07-19 DIAGNOSIS — H52223 Regular astigmatism, bilateral: Secondary | ICD-10-CM | POA: Diagnosis not present

## 2019-07-19 DIAGNOSIS — H2513 Age-related nuclear cataract, bilateral: Secondary | ICD-10-CM | POA: Diagnosis not present

## 2019-07-19 DIAGNOSIS — H11441 Conjunctival cysts, right eye: Secondary | ICD-10-CM | POA: Diagnosis not present

## 2019-09-28 ENCOUNTER — Ambulatory Visit: Payer: PPO

## 2019-10-06 ENCOUNTER — Ambulatory Visit: Payer: PPO

## 2019-10-15 ENCOUNTER — Ambulatory Visit: Payer: PPO

## 2019-11-02 DIAGNOSIS — L814 Other melanin hyperpigmentation: Secondary | ICD-10-CM | POA: Diagnosis not present

## 2019-11-02 DIAGNOSIS — L820 Inflamed seborrheic keratosis: Secondary | ICD-10-CM | POA: Diagnosis not present

## 2019-11-02 DIAGNOSIS — L821 Other seborrheic keratosis: Secondary | ICD-10-CM | POA: Diagnosis not present

## 2019-11-02 DIAGNOSIS — D225 Melanocytic nevi of trunk: Secondary | ICD-10-CM | POA: Diagnosis not present

## 2019-11-02 DIAGNOSIS — L905 Scar conditions and fibrosis of skin: Secondary | ICD-10-CM | POA: Diagnosis not present

## 2019-11-02 DIAGNOSIS — R208 Other disturbances of skin sensation: Secondary | ICD-10-CM | POA: Diagnosis not present

## 2020-03-17 ENCOUNTER — Other Ambulatory Visit: Payer: Self-pay | Admitting: Internal Medicine

## 2020-03-17 DIAGNOSIS — Z1231 Encounter for screening mammogram for malignant neoplasm of breast: Secondary | ICD-10-CM

## 2020-03-31 ENCOUNTER — Ambulatory Visit: Payer: PPO

## 2020-04-14 ENCOUNTER — Ambulatory Visit
Admission: RE | Admit: 2020-04-14 | Discharge: 2020-04-14 | Disposition: A | Discharge status: Home / Self Care | Payer: PPO | Source: Ambulatory Visit | Attending: Internal Medicine | Admitting: Internal Medicine

## 2020-04-14 ENCOUNTER — Other Ambulatory Visit: Payer: Self-pay | Admitting: Internal Medicine

## 2020-04-14 ENCOUNTER — Other Ambulatory Visit: Payer: Self-pay

## 2020-04-14 DIAGNOSIS — Z1231 Encounter for screening mammogram for malignant neoplasm of breast: Secondary | ICD-10-CM

## 2020-05-09 DIAGNOSIS — R05 Cough: Secondary | ICD-10-CM | POA: Diagnosis not present

## 2020-05-09 DIAGNOSIS — U071 COVID-19: Secondary | ICD-10-CM | POA: Diagnosis not present

## 2020-05-09 DIAGNOSIS — Z1152 Encounter for screening for COVID-19: Secondary | ICD-10-CM | POA: Diagnosis not present

## 2020-05-10 ENCOUNTER — Telehealth (HOSPITAL_COMMUNITY): Payer: Self-pay | Admitting: Nurse Practitioner

## 2020-05-10 ENCOUNTER — Other Ambulatory Visit: Payer: Self-pay | Admitting: Nurse Practitioner

## 2020-05-10 DIAGNOSIS — Z6828 Body mass index (BMI) 28.0-28.9, adult: Secondary | ICD-10-CM

## 2020-05-10 DIAGNOSIS — U071 COVID-19: Secondary | ICD-10-CM

## 2020-05-10 NOTE — Telephone Encounter (Signed)
Called to discuss with Jola Babinski about Covid symptoms and the use of casirivimab/imdevimab, a combination monoclonal antibody infusion for those with mild to moderate Covid symptoms and at a high risk of hospitalization.     Pt is qualified for this infusion at the [facility] infusion center due to co-morbid conditions (as indicated below, BMI >25, age 69) and/or a member of an at-risk group, however wishes to further consider infusion at this time. Patient advised to call back if she decides that he does want to get infusion. Callback number to the infusion center given. Patient advised to go to Urgent care or ED with severe symptoms. Last date she would be eligible for infusion is 05/15/20.    Patient Active Problem List   Diagnosis Date Noted  . Painful orthopaedic hardware ([place]) 09/28/2018  . Closed displaced comminuted fracture of shaft of left tibia 07/01/2017  . Bimalleolar ankle fracture, left, closed, initial encounter 07/01/2017  . History of open reduction and internal fixation (ORIF) procedure 07/01/2017  . History of breast cancer 05/24/2013    Alda Lea, AGPCNP-BC

## 2020-05-10 NOTE — Progress Notes (Signed)
I connected by phone with [name] on 05/10/2020 at 5:03 PM to discuss the potential use of a new treatment for mild to moderate COVID-19 viral infection in non-hospitalized patients.  This patient is a 69 y.o. female that meets the FDA criteria for Emergency Use Authorization of COVID monoclonal antibody casirivimab/imdevimab.  Has a (+) direct SARS-CoV-2 viral test result  Has mild or moderate COVID-19   Is NOT hospitalized due to COVID-19  Is within 10 days of symptom onset  Has at least one of the high risk factor(s) for progression to severe COVID-19 and/or hospitalization as defined in EUA.  Specific high risk criteria : Older age (>/= 71 yo) and BMI > 25   I have spoken and communicated the following to the patient or parent/caregiver regarding COVID monoclonal antibody treatment:  1. FDA has authorized the emergency use for the treatment of mild to moderate COVID-19 in adults and pediatric patients with positive results of direct SARS-CoV-2 viral testing who are 34 years of age and older weighing at least 40 kg, and who are at high risk for progressing to severe COVID-19 and/or hospitalization.  2. The significant known and potential risks and benefits of COVID monoclonal antibody, and the extent to which such potential risks and benefits are unknown.  3. Information on available alternative treatments and the risks and benefits of those alternatives, including clinical trials.  4. Patients treated with COVID monoclonal antibody should continue to self-isolate and use infection control measures (e.g., wear mask, isolate, social distance, avoid sharing personal items, clean and disinfect "high touch" surfaces, and frequent handwashing) according to CDC guidelines.   5. The patient or parent/caregiver has the option to accept or refuse COVID monoclonal antibody treatment.  After reviewing this information with the patient, The patient agreed to proceed with receiving  casirivimab\imdevimab infusion and will be provided a copy of the Fact sheet prior to receiving the infusion.  Sx onset 05/07/20. Set up for infusion on Sunday 05/12/20 at 10:30 am. Directions given to [facility]. Pt is aware that insurance will be charged an infusion fee.   Ranae Pila 05/10/2020 5:03 PM

## 2020-05-12 ENCOUNTER — Ambulatory Visit (HOSPITAL_COMMUNITY)
Admission: RE | Admit: 2020-05-12 | Discharge: 2020-05-12 | Disposition: A | Discharge status: Home / Self Care | Payer: Medicare Other | Source: Ambulatory Visit | Attending: Pulmonary Disease | Admitting: Pulmonary Disease

## 2020-05-12 DIAGNOSIS — U071 COVID-19: Secondary | ICD-10-CM | POA: Diagnosis present

## 2020-05-12 DIAGNOSIS — Z23 Encounter for immunization: Secondary | ICD-10-CM | POA: Insufficient documentation

## 2020-05-12 DIAGNOSIS — Z6828 Body mass index (BMI) 28.0-28.9, adult: Secondary | ICD-10-CM | POA: Diagnosis not present

## 2020-05-12 MED ORDER — EPINEPHRINE 0.3 MG/0.3ML IJ SOAJ: 0.3000 mg | Freq: Once | INTRAMUSCULAR | Status: DC | PRN

## 2020-05-12 MED ORDER — SODIUM CHLORIDE 0.9 % IV SOLN: INTRAVENOUS | Status: DC | PRN

## 2020-05-12 MED ORDER — METHYLPREDNISOLONE SODIUM SUCC 125 MG IJ SOLR: 125.0000 mg | Freq: Once | INTRAMUSCULAR | Status: DC | PRN

## 2020-05-12 MED ORDER — ALBUTEROL SULFATE HFA 108 (90 BASE) MCG/ACT IN AERS: 2.0000 | INHALATION_SPRAY | Freq: Once | RESPIRATORY_TRACT | Status: DC | PRN

## 2020-05-12 MED ORDER — FAMOTIDINE IN NACL 20-0.9 MG/50ML-% IV SOLN: 20.0000 mg | Freq: Once | INTRAVENOUS | Status: DC | PRN

## 2020-05-12 MED ORDER — SODIUM CHLORIDE 0.9 % IV SOLN
1200.0000 mg | Freq: Once | INTRAVENOUS | Status: AC
  Administered 2020-05-12: 1200 mg via INTRAVENOUS
  Filled 2020-05-12: qty 10

## 2020-05-12 MED ORDER — DIPHENHYDRAMINE HCL 50 MG/ML IJ SOLN: 50.0000 mg | Freq: Once | INTRAMUSCULAR | Status: DC | PRN

## 2020-05-12 NOTE — Discharge Instructions (Signed)

## 2020-05-12 NOTE — Progress Notes (Signed)
  Diagnosis: COVID-19  Physician: Dr. Asencion Noble  Procedure: Covid Infusion Clinic Med: casirivimab\imdevimab infusion - Provided patient with casirivimab\imdevimab fact sheet for patients, parents and caregivers prior to infusion.  Complications: No immediate complications noted.  Discharge: Discharged home   [name] 05/12/2020

## 2020-05-30 DIAGNOSIS — E785 Hyperlipidemia, unspecified: Secondary | ICD-10-CM | POA: Diagnosis not present

## 2020-05-30 DIAGNOSIS — E559 Vitamin D deficiency, unspecified: Secondary | ICD-10-CM | POA: Diagnosis not present

## 2020-06-05 DIAGNOSIS — M1990 Unspecified osteoarthritis, unspecified site: Secondary | ICD-10-CM | POA: Diagnosis not present

## 2020-06-05 DIAGNOSIS — Z0000 Encounter for general adult medical examination without abnormal findings: Secondary | ICD-10-CM | POA: Diagnosis not present

## 2020-06-05 DIAGNOSIS — E559 Vitamin D deficiency, unspecified: Secondary | ICD-10-CM | POA: Diagnosis not present

## 2020-06-05 DIAGNOSIS — N951 Menopausal and female climacteric states: Secondary | ICD-10-CM | POA: Diagnosis not present

## 2020-06-05 DIAGNOSIS — Z23 Encounter for immunization: Secondary | ICD-10-CM | POA: Diagnosis not present

## 2020-06-05 DIAGNOSIS — M8580 Other specified disorders of bone density and structure, unspecified site: Secondary | ICD-10-CM | POA: Diagnosis not present

## 2020-06-05 DIAGNOSIS — E7849 Other hyperlipidemia: Secondary | ICD-10-CM | POA: Diagnosis not present

## 2020-06-05 DIAGNOSIS — R82998 Other abnormal findings in urine: Secondary | ICD-10-CM | POA: Diagnosis not present

## 2020-06-05 DIAGNOSIS — Z853 Personal history of malignant neoplasm of breast: Secondary | ICD-10-CM | POA: Diagnosis not present

## 2020-06-20 DIAGNOSIS — Z1212 Encounter for screening for malignant neoplasm of rectum: Secondary | ICD-10-CM | POA: Diagnosis not present

## 2020-10-31 DIAGNOSIS — D229 Melanocytic nevi, unspecified: Secondary | ICD-10-CM | POA: Diagnosis not present

## 2020-10-31 DIAGNOSIS — L820 Inflamed seborrheic keratosis: Secondary | ICD-10-CM | POA: Diagnosis not present

## 2020-10-31 DIAGNOSIS — D1801 Hemangioma of skin and subcutaneous tissue: Secondary | ICD-10-CM | POA: Diagnosis not present

## 2020-10-31 DIAGNOSIS — L821 Other seborrheic keratosis: Secondary | ICD-10-CM | POA: Diagnosis not present

## 2021-03-30 ENCOUNTER — Other Ambulatory Visit: Payer: Self-pay | Admitting: Internal Medicine

## 2021-03-30 DIAGNOSIS — Z1231 Encounter for screening mammogram for malignant neoplasm of breast: Secondary | ICD-10-CM

## 2021-05-26 ENCOUNTER — Ambulatory Visit
Admission: RE | Admit: 2021-05-26 | Discharge: 2021-05-26 | Disposition: A | Discharge status: Home / Self Care | Payer: PPO | Source: Ambulatory Visit | Attending: Internal Medicine | Admitting: Internal Medicine

## 2021-05-26 ENCOUNTER — Other Ambulatory Visit: Payer: Self-pay

## 2021-05-26 DIAGNOSIS — Z1231 Encounter for screening mammogram for malignant neoplasm of breast: Secondary | ICD-10-CM | POA: Diagnosis not present

## 2021-06-01 ENCOUNTER — Other Ambulatory Visit: Payer: Self-pay | Admitting: Internal Medicine

## 2021-06-01 DIAGNOSIS — R928 Other abnormal and inconclusive findings on diagnostic imaging of breast: Secondary | ICD-10-CM

## 2021-06-12 DIAGNOSIS — E785 Hyperlipidemia, unspecified: Secondary | ICD-10-CM | POA: Diagnosis not present

## 2021-06-12 DIAGNOSIS — E559 Vitamin D deficiency, unspecified: Secondary | ICD-10-CM | POA: Diagnosis not present

## 2021-06-18 ENCOUNTER — Other Ambulatory Visit: Payer: PPO

## 2021-06-19 ENCOUNTER — Ambulatory Visit
Admission: RE | Admit: 2021-06-19 | Discharge: 2021-06-19 | Disposition: A | Discharge status: Home / Self Care | Payer: PPO | Source: Ambulatory Visit | Attending: Internal Medicine | Admitting: Internal Medicine

## 2021-06-19 ENCOUNTER — Other Ambulatory Visit: Payer: Self-pay

## 2021-06-19 ENCOUNTER — Other Ambulatory Visit: Payer: Self-pay | Admitting: Internal Medicine

## 2021-06-19 DIAGNOSIS — R922 Inconclusive mammogram: Secondary | ICD-10-CM | POA: Diagnosis not present

## 2021-06-19 DIAGNOSIS — E559 Vitamin D deficiency, unspecified: Secondary | ICD-10-CM | POA: Diagnosis not present

## 2021-06-19 DIAGNOSIS — M8580 Other specified disorders of bone density and structure, unspecified site: Secondary | ICD-10-CM | POA: Diagnosis not present

## 2021-06-19 DIAGNOSIS — Z1212 Encounter for screening for malignant neoplasm of rectum: Secondary | ICD-10-CM | POA: Diagnosis not present

## 2021-06-19 DIAGNOSIS — R928 Other abnormal and inconclusive findings on diagnostic imaging of breast: Secondary | ICD-10-CM

## 2021-06-19 DIAGNOSIS — M1990 Unspecified osteoarthritis, unspecified site: Secondary | ICD-10-CM | POA: Diagnosis not present

## 2021-06-19 DIAGNOSIS — R82998 Other abnormal findings in urine: Secondary | ICD-10-CM | POA: Diagnosis not present

## 2021-06-19 DIAGNOSIS — Z1339 Encounter for screening examination for other mental health and behavioral disorders: Secondary | ICD-10-CM | POA: Diagnosis not present

## 2021-06-19 DIAGNOSIS — Z1331 Encounter for screening for depression: Secondary | ICD-10-CM | POA: Diagnosis not present

## 2021-06-19 DIAGNOSIS — N951 Menopausal and female climacteric states: Secondary | ICD-10-CM | POA: Diagnosis not present

## 2021-06-19 DIAGNOSIS — Z853 Personal history of malignant neoplasm of breast: Secondary | ICD-10-CM | POA: Diagnosis not present

## 2021-06-19 DIAGNOSIS — Z23 Encounter for immunization: Secondary | ICD-10-CM | POA: Diagnosis not present

## 2021-06-19 DIAGNOSIS — E785 Hyperlipidemia, unspecified: Secondary | ICD-10-CM | POA: Diagnosis not present

## 2021-06-19 DIAGNOSIS — Z0000 Encounter for general adult medical examination without abnormal findings: Secondary | ICD-10-CM | POA: Diagnosis not present

## 2021-06-23 ENCOUNTER — Ambulatory Visit
Admission: RE | Admit: 2021-06-23 | Discharge: 2021-06-23 | Disposition: A | Discharge status: Home / Self Care | Payer: PPO | Source: Ambulatory Visit | Attending: Internal Medicine | Admitting: Internal Medicine

## 2021-06-23 ENCOUNTER — Other Ambulatory Visit: Payer: Self-pay | Admitting: Internal Medicine

## 2021-06-23 ENCOUNTER — Other Ambulatory Visit: Payer: Self-pay

## 2021-06-23 DIAGNOSIS — R928 Other abnormal and inconclusive findings on diagnostic imaging of breast: Secondary | ICD-10-CM

## 2021-06-23 DIAGNOSIS — N62 Hypertrophy of breast: Secondary | ICD-10-CM | POA: Diagnosis not present

## 2021-06-23 DIAGNOSIS — N6311 Unspecified lump in the right breast, upper outer quadrant: Secondary | ICD-10-CM | POA: Diagnosis not present

## 2021-06-23 DIAGNOSIS — D241 Benign neoplasm of right breast: Secondary | ICD-10-CM | POA: Diagnosis not present

## 2021-06-23 DIAGNOSIS — E785 Hyperlipidemia, unspecified: Secondary | ICD-10-CM

## 2021-06-23 HISTORY — PX: BREAST BIOPSY: SHX20

## 2021-07-13 ENCOUNTER — Ambulatory Visit
Admission: RE | Admit: 2021-07-13 | Discharge: 2021-07-13 | Disposition: A | Discharge status: Home / Self Care | Payer: No Typology Code available for payment source | Source: Ambulatory Visit | Attending: Internal Medicine | Admitting: Internal Medicine

## 2021-07-13 DIAGNOSIS — E785 Hyperlipidemia, unspecified: Secondary | ICD-10-CM

## 2021-07-16 DIAGNOSIS — D241 Benign neoplasm of right breast: Secondary | ICD-10-CM | POA: Diagnosis not present

## 2021-09-14 DIAGNOSIS — M8589 Other specified disorders of bone density and structure, multiple sites: Secondary | ICD-10-CM | POA: Diagnosis not present

## 2021-09-14 DIAGNOSIS — N951 Menopausal and female climacteric states: Secondary | ICD-10-CM | POA: Diagnosis not present

## 2021-12-03 DIAGNOSIS — L814 Other melanin hyperpigmentation: Secondary | ICD-10-CM | POA: Diagnosis not present

## 2021-12-03 DIAGNOSIS — L298 Other pruritus: Secondary | ICD-10-CM | POA: Diagnosis not present

## 2021-12-03 DIAGNOSIS — D225 Melanocytic nevi of trunk: Secondary | ICD-10-CM | POA: Diagnosis not present

## 2021-12-03 DIAGNOSIS — D485 Neoplasm of uncertain behavior of skin: Secondary | ICD-10-CM | POA: Diagnosis not present

## 2021-12-03 DIAGNOSIS — L820 Inflamed seborrheic keratosis: Secondary | ICD-10-CM | POA: Diagnosis not present

## 2021-12-03 DIAGNOSIS — L821 Other seborrheic keratosis: Secondary | ICD-10-CM | POA: Diagnosis not present

## 2021-12-03 DIAGNOSIS — L538 Other specified erythematous conditions: Secondary | ICD-10-CM | POA: Diagnosis not present

## 2021-12-03 DIAGNOSIS — R238 Other skin changes: Secondary | ICD-10-CM | POA: Diagnosis not present

## 2022-01-19 ENCOUNTER — Other Ambulatory Visit: Payer: Self-pay | Admitting: General Surgery

## 2022-01-19 DIAGNOSIS — D241 Benign neoplasm of right breast: Secondary | ICD-10-CM

## 2022-01-22 DIAGNOSIS — M25552 Pain in left hip: Secondary | ICD-10-CM | POA: Diagnosis not present

## 2022-01-27 ENCOUNTER — Other Ambulatory Visit: Payer: PPO

## 2022-02-03 ENCOUNTER — Ambulatory Visit
Admission: RE | Admit: 2022-02-03 | Discharge: 2022-02-03 | Disposition: A | Discharge status: Home / Self Care | Payer: PPO | Source: Ambulatory Visit | Attending: General Surgery | Admitting: General Surgery

## 2022-02-03 DIAGNOSIS — R922 Inconclusive mammogram: Secondary | ICD-10-CM | POA: Diagnosis not present

## 2022-02-03 DIAGNOSIS — D241 Benign neoplasm of right breast: Secondary | ICD-10-CM

## 2022-02-03 DIAGNOSIS — N6311 Unspecified lump in the right breast, upper outer quadrant: Secondary | ICD-10-CM | POA: Diagnosis not present

## 2022-02-03 DIAGNOSIS — N6313 Unspecified lump in the right breast, lower outer quadrant: Secondary | ICD-10-CM | POA: Diagnosis not present

## 2022-03-23 DIAGNOSIS — S76212A Strain of adductor muscle, fascia and tendon of left thigh, initial encounter: Secondary | ICD-10-CM | POA: Diagnosis not present

## 2022-03-23 DIAGNOSIS — N3941 Urge incontinence: Secondary | ICD-10-CM | POA: Diagnosis not present

## 2022-04-01 DIAGNOSIS — M25552 Pain in left hip: Secondary | ICD-10-CM | POA: Diagnosis not present

## 2022-04-05 DIAGNOSIS — M25552 Pain in left hip: Secondary | ICD-10-CM | POA: Diagnosis not present

## 2022-04-07 DIAGNOSIS — M25552 Pain in left hip: Secondary | ICD-10-CM | POA: Diagnosis not present

## 2022-04-16 DIAGNOSIS — M25552 Pain in left hip: Secondary | ICD-10-CM | POA: Diagnosis not present

## 2022-04-19 DIAGNOSIS — M25552 Pain in left hip: Secondary | ICD-10-CM | POA: Diagnosis not present

## 2022-04-21 DIAGNOSIS — M25552 Pain in left hip: Secondary | ICD-10-CM | POA: Diagnosis not present

## 2022-04-28 DIAGNOSIS — M25552 Pain in left hip: Secondary | ICD-10-CM | POA: Diagnosis not present

## 2022-05-05 DIAGNOSIS — M1712 Unilateral primary osteoarthritis, left knee: Secondary | ICD-10-CM | POA: Diagnosis not present

## 2022-05-05 DIAGNOSIS — M25562 Pain in left knee: Secondary | ICD-10-CM | POA: Diagnosis not present

## 2022-05-05 DIAGNOSIS — M25552 Pain in left hip: Secondary | ICD-10-CM | POA: Diagnosis not present

## 2022-05-05 DIAGNOSIS — M1612 Unilateral primary osteoarthritis, left hip: Secondary | ICD-10-CM | POA: Diagnosis not present

## 2022-05-07 DIAGNOSIS — M25552 Pain in left hip: Secondary | ICD-10-CM | POA: Diagnosis not present

## 2022-05-10 DIAGNOSIS — M25552 Pain in left hip: Secondary | ICD-10-CM | POA: Diagnosis not present

## 2022-05-12 DIAGNOSIS — M25552 Pain in left hip: Secondary | ICD-10-CM | POA: Diagnosis not present

## 2022-05-17 DIAGNOSIS — M25552 Pain in left hip: Secondary | ICD-10-CM | POA: Diagnosis not present

## 2022-05-19 DIAGNOSIS — M25552 Pain in left hip: Secondary | ICD-10-CM | POA: Diagnosis not present

## 2022-05-24 DIAGNOSIS — M25552 Pain in left hip: Secondary | ICD-10-CM | POA: Diagnosis not present

## 2022-05-28 DIAGNOSIS — M25552 Pain in left hip: Secondary | ICD-10-CM | POA: Diagnosis not present

## 2022-06-16 DIAGNOSIS — M1712 Unilateral primary osteoarthritis, left knee: Secondary | ICD-10-CM | POA: Diagnosis not present

## 2022-06-16 DIAGNOSIS — M25562 Pain in left knee: Secondary | ICD-10-CM | POA: Diagnosis not present

## 2022-06-16 DIAGNOSIS — M1612 Unilateral primary osteoarthritis, left hip: Secondary | ICD-10-CM | POA: Diagnosis not present

## 2022-06-16 DIAGNOSIS — M25552 Pain in left hip: Secondary | ICD-10-CM | POA: Diagnosis not present

## 2022-07-06 ENCOUNTER — Encounter: Payer: Self-pay | Admitting: Orthopaedic Surgery

## 2022-07-06 ENCOUNTER — Ambulatory Visit (INDEPENDENT_AMBULATORY_CARE_PROVIDER_SITE_OTHER): Payer: PPO

## 2022-07-06 ENCOUNTER — Ambulatory Visit (INDEPENDENT_AMBULATORY_CARE_PROVIDER_SITE_OTHER): Payer: PPO | Admitting: Orthopaedic Surgery

## 2022-07-06 ENCOUNTER — Ambulatory Visit: Payer: Self-pay

## 2022-07-06 DIAGNOSIS — M25562 Pain in left knee: Secondary | ICD-10-CM

## 2022-07-06 DIAGNOSIS — G8929 Other chronic pain: Secondary | ICD-10-CM

## 2022-07-06 DIAGNOSIS — M25552 Pain in left hip: Secondary | ICD-10-CM | POA: Diagnosis not present

## 2022-07-06 MED ORDER — METHYLPREDNISOLONE ACETATE 40 MG/ML IJ SUSP
80.0000 mg | INTRAMUSCULAR | Status: AC | PRN
  Administered 2022-07-06: 80 mg via INTRA_ARTICULAR

## 2022-07-06 MED ORDER — LIDOCAINE HCL 1 % IJ SOLN
4.0000 mL | INTRAMUSCULAR | Status: AC | PRN
  Administered 2022-07-06: 4 mL

## 2022-07-06 NOTE — Progress Notes (Signed)
   Procedure Note  Patient: [name]             Date of Birth: 25-Sep-1950           MRN: [id number]             Visit Date: 07/06/2022  Procedures: Visit Diagnoses:  1. Pain in left hip   2. Chronic pain of left knee    Large Joint Inj: L hip joint on 07/06/2022 1:49 PM Indications: pain Details: 22 G 3.5 in needle, ultrasound-guided anterior approach Medications: 4 mL lidocaine 1 %; 80 mg methylPREDNISolone acetate 40 MG/ML Outcome: tolerated well, no immediate complications  Procedure: US-guided intra-articular hip injection, left After discussion on risks/benefits/indications and informed verbal consent was obtained, a timeout was performed. Patient was lying supine on exam table. The hip was cleaned with betadine and alcohol swabs. Then utilizing ultrasound guidance, the patient's femoral head and neck junction was identified and subsequently injected with 4:2 lidocaine:depomedrol via an in-plane approach with ultrasound visualization of the injectate administered into the hip joint. Patient tolerated procedure well without immediate complications.  Procedure, treatment alternatives, risks and benefits explained, specific risks discussed. Consent was given by the patient. Immediately prior to procedure a time out was called to verify the correct patient, procedure, equipment, support staff and site/side marked as required. Patient was prepped and draped in the usual sterile fashion.     - I evaluated the patient about 10 minutes post-injection and she unfortunately did not have much improvement in pain and range of motion within the hip joint - follow-up with Dr. Erlinda Hong as indicated; I am happy to see them as needed -Discussed if pain is continuing on the lateral aspect of the hip, to let me know and we could always consider a greater trochanteric injection  Elba Barman, DO [place]  This note was dictated using  Dragon naturally speaking software and may contain errors in syntax, spelling, or content which have not been identified prior to signing this note.

## 2022-07-06 NOTE — Progress Notes (Signed)
Office Visit Note   Patient: [name]           Date of Birth: Dec 27, 1950           MRN: [id number] Visit Date: 07/06/2022              Requested by: Shon Baton, [address] [place],  [place] [address] PCP: Shon Baton, MD   Assessment & Plan: Visit Diagnoses:  1. Chronic pain of left knee   2. Pain in left hip     Plan: In regards to the left hip she does have severe osteoarthritis with bone-on-bone changes.  Clinically speaking she seems to be well compensated for this.  She does have a fair amount of medial sided knee pain with tenderness to palpation.  Unsure if this is coming from the knee or referred from the hip.  Based on options she would like to move forward with MRI of the left knee to rule out structural abnormalities as well as intra-articular left hip injection.  I will communicate with her through [place] regarding the MRI results and further steps.  Follow-Up Instructions: No follow-ups on file.   Orders:  Orders Placed This Encounter  Procedures   XR KNEE 3 VIEW LEFT   XR HIP UNILAT W OR W/O PELVIS 2-3 VIEWS LEFT   No orders of the defined types were placed in this encounter.     Procedures: No procedures performed   Clinical Data: No additional findings.   Subjective: Chief Complaint  Patient presents with   Left Hip - Pain   Left Knee - Pain    HPI Patient is a 71 year old female who is well-known to me.  I fixed her tibia fracture a few years ago.  She started having growing and medial left thigh pain in February and then had a fall in July when she was in [place].  She was seen at the [facility] walk-in clinic and found to have bone-on-bone hip arthritis.  She had a consultation with Dr. Ihor Gully who recommended hip replacement.  She saw Dr. Virgina Jock her PCP who ordered PT for the hip and this has helped significantly with strength and flexibility but continues to have pain.  Has not had a cortisone injection.  Had a left knee injection in  August without much relief.  Continues to have medial sided knee pain that causes her to limp and limits her activities.  Review of Systems  Constitutional: Negative.   HENT: Negative.    Eyes: Negative.   Respiratory: Negative.    Cardiovascular: Negative.   Endocrine: Negative.   Musculoskeletal: Negative.   Neurological: Negative.   Hematological: Negative.   Psychiatric/Behavioral: Negative.    All other systems reviewed and are negative.    Objective: Vital Signs: There were no vitals taken for this visit.  Physical Exam Vitals and nursing note reviewed.  Constitutional:      Appearance: She is well-developed.  HENT:     Head: Atraumatic.     Nose: Nose normal.  Eyes:     Extraocular Movements: Extraocular movements intact.  Cardiovascular:     Pulses: Normal pulses.  Pulmonary:     Effort: Pulmonary effort is normal.  Abdominal:     Palpations: Abdomen is soft.  Musculoskeletal:     Cervical back: Neck supple.  Skin:    General: Skin is warm.     Capillary Refill: Capillary refill takes less than 2 seconds.  Neurological:     Mental Status:  She is alert. Mental status is at baseline.  Psychiatric:        Behavior: Behavior normal.        Thought Content: Thought content normal.        Judgment: Judgment normal.     Ortho Exam Examination of the left hip shows no pain with logroll.  No trochanteric tenderness.  Internal rotation to 10 degrees with mild pain.  External rotation to 45 degrees with mild pain.  Equivocal Stinchfield.  No sciatic tension signs.  Examination of left knee shows medial joint line tenderness.  Trace effusion.  1+ patellofemoral crepitus with range of motion.  Collaterals and cruciates are stable. Specialty Comments:  No specialty comments available.  Imaging: XR KNEE 3 VIEW LEFT  Result Date: 07/06/2022 Three-view x-rays of the left knee show mild osteoarthritis with periarticular spurring.  Well-preserved femoral-tibial joint  spaces.  XR HIP UNILAT W OR W/O PELVIS 2-3 VIEWS LEFT  Result Date: 07/06/2022 Advanced degenerative joint disease with bone-on-bone joint space narrowing.  Remodeling of left femoral head.  Slight subchondral collapse.    PMFS History: Patient Active Problem List   Diagnosis Date Noted   Painful orthopaedic hardware ([place]) 09/28/2018   Closed displaced comminuted fracture of shaft of left tibia 07/01/2017   Bimalleolar ankle fracture, left, closed, initial encounter 07/01/2017   History of open reduction and internal fixation (ORIF) procedure 07/01/2017   History of breast cancer 05/24/2013   Past Medical History:  Diagnosis Date   Arthritis    "right hip; hands" (07/01/2017)   Cancer of left breast ([place]) 08/2003   S/p left breast mastectomy   Carpal tunnel syndrome, bilateral    PONV (postoperative nausea and vomiting)     Family History  Problem Relation Age of Onset   Alzheimer's disease Mother    Seizures Mother     Past Surgical History:  Procedure Laterality Date   BREAST BIOPSY Left 2005   CARPAL TUNNEL RELEASE Bilateral    COLONOSCOPY     FRACTURE SURGERY     MASTECTOMY Left 10/14/2003   MASTECTOMY WITH AXILLARY LYMPH NODE DISSECTION Left 10/14/2003   OPEN REDUCTION INTERNAL FIXATION (ORIF) TIBIA/FIBULA FRACTURE Left 07/01/2017   OPEN REDUCTION INTERNAL FIXATION (ORIF) LEFT TIBIA/PILON FRACTURE/FIBULA FRACTURE    ORIF ANKLE FRACTURE Left 07/01/2017   Procedure: OPEN REDUCTION INTERNAL FIXATION (ORIF) LEFT TIBIA/PILON FRACTURE/FIBULA FRACTURE;  Surgeon: Leandrew Koyanagi, MD;  Location: [place];  Service: Orthopedics;  Laterality: Left;   RECONSTRUCTION BREAST W/ TRAM FLAP Left 10/14/2003   Social History   Occupational History   Not on file  Tobacco Use   Smoking status: Never   Smokeless tobacco: Never  Vaping Use   Vaping Use: Never used  Substance and Sexual Activity   Alcohol use: Yes    Alcohol/week: 6.0 standard drinks of alcohol    Types: 6 Glasses of wine  per week   Drug use: No   Sexual activity: Yes    Birth control/protection: Post-menopausal

## 2022-07-07 NOTE — Addendum Note (Signed)
Addended by: Lendon Collar on: 07/07/2022 01:58 PM   Modules accepted: Orders

## 2022-07-09 DIAGNOSIS — E785 Hyperlipidemia, unspecified: Secondary | ICD-10-CM | POA: Diagnosis not present

## 2022-07-09 DIAGNOSIS — E559 Vitamin D deficiency, unspecified: Secondary | ICD-10-CM | POA: Diagnosis not present

## 2022-07-09 DIAGNOSIS — R7989 Other specified abnormal findings of blood chemistry: Secondary | ICD-10-CM | POA: Diagnosis not present

## 2022-07-11 DIAGNOSIS — Z1212 Encounter for screening for malignant neoplasm of rectum: Secondary | ICD-10-CM | POA: Diagnosis not present

## 2022-07-16 DIAGNOSIS — E785 Hyperlipidemia, unspecified: Secondary | ICD-10-CM | POA: Diagnosis not present

## 2022-07-16 DIAGNOSIS — Z1389 Encounter for screening for other disorder: Secondary | ICD-10-CM | POA: Diagnosis not present

## 2022-07-16 DIAGNOSIS — Z0000 Encounter for general adult medical examination without abnormal findings: Secondary | ICD-10-CM | POA: Diagnosis not present

## 2022-07-16 DIAGNOSIS — Z23 Encounter for immunization: Secondary | ICD-10-CM | POA: Diagnosis not present

## 2022-07-16 DIAGNOSIS — Z853 Personal history of malignant neoplasm of breast: Secondary | ICD-10-CM | POA: Diagnosis not present

## 2022-07-16 DIAGNOSIS — M1990 Unspecified osteoarthritis, unspecified site: Secondary | ICD-10-CM | POA: Diagnosis not present

## 2022-07-16 DIAGNOSIS — Z1331 Encounter for screening for depression: Secondary | ICD-10-CM | POA: Diagnosis not present

## 2022-07-16 DIAGNOSIS — E559 Vitamin D deficiency, unspecified: Secondary | ICD-10-CM | POA: Diagnosis not present

## 2022-07-16 DIAGNOSIS — M8580 Other specified disorders of bone density and structure, unspecified site: Secondary | ICD-10-CM | POA: Diagnosis not present

## 2022-07-16 DIAGNOSIS — N951 Menopausal and female climacteric states: Secondary | ICD-10-CM | POA: Diagnosis not present

## 2022-07-16 DIAGNOSIS — R82998 Other abnormal findings in urine: Secondary | ICD-10-CM | POA: Diagnosis not present

## 2022-07-19 ENCOUNTER — Ambulatory Visit
Admission: RE | Admit: 2022-07-19 | Discharge: 2022-07-19 | Disposition: A | Discharge status: Home / Self Care | Payer: PPO | Source: Ambulatory Visit | Attending: Orthopaedic Surgery | Admitting: Orthopaedic Surgery

## 2022-07-19 DIAGNOSIS — G8929 Other chronic pain: Secondary | ICD-10-CM

## 2022-07-19 DIAGNOSIS — M25562 Pain in left knee: Secondary | ICD-10-CM | POA: Diagnosis not present

## 2022-07-20 DIAGNOSIS — M9904 Segmental and somatic dysfunction of sacral region: Secondary | ICD-10-CM | POA: Diagnosis not present

## 2022-07-20 DIAGNOSIS — M5136 Other intervertebral disc degeneration, lumbar region: Secondary | ICD-10-CM | POA: Diagnosis not present

## 2022-07-20 DIAGNOSIS — M9905 Segmental and somatic dysfunction of pelvic region: Secondary | ICD-10-CM | POA: Diagnosis not present

## 2022-07-20 DIAGNOSIS — M9903 Segmental and somatic dysfunction of lumbar region: Secondary | ICD-10-CM | POA: Diagnosis not present

## 2022-07-30 ENCOUNTER — Ambulatory Visit (INDEPENDENT_AMBULATORY_CARE_PROVIDER_SITE_OTHER): Payer: PPO | Admitting: Orthopaedic Surgery

## 2022-07-30 ENCOUNTER — Telehealth: Payer: PPO

## 2022-07-30 DIAGNOSIS — M1712 Unilateral primary osteoarthritis, left knee: Secondary | ICD-10-CM | POA: Diagnosis not present

## 2022-07-30 MED ORDER — TRAMADOL HCL 50 MG PO TABS: 50.0000 mg | ORAL_TABLET | Freq: Every day | ORAL | 0 refills | Status: DC | PRN

## 2022-07-30 MED ORDER — INDOMETHACIN 50 MG PO CAPS: 50.0000 mg | ORAL_CAPSULE | Freq: Two times a day (BID) | ORAL | 0 refills | Status: DC

## 2022-07-30 NOTE — Progress Notes (Addendum)
Office Visit Note   Patient: [name]           Date of Birth: [birth year]           MRN: [id number] Visit Date: 07/30/2022              Requested by: Shon Baton, [place],  [place] [id number] PCP: Shon Baton, MD   Assessment & Plan: Visit Diagnoses:  1. Primary osteoarthritis of left knee     Plan: Based on findings I think that knee is actually causing [place] fair amount of problems and symptoms.  The fact that her hip is bone-on-bone may play into her knee symptoms but I think primarily the knee is the source of the pain.  The MRI scan and the results were reviewed with her in detail.  We will put the hip on the back burner for now.  In terms of the knee the overall findings on MRI are consistent with degenerative joint disease.  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  There is reactive subchondral edema and spurring.  She has already tried a cortisone injection which did not provide significant relief.  Other treatments discussed to include medial unloader brace due to instability and viscosupplementation which she would like to try both.  I will send in prescription for tramadol for severe pain.  Indomethacin was also prescribed to be taken on a limited basis.  Questions encouraged and answered.  She will follow-up for the Visco injection.  This patient is diagnosed with osteoarthritis of the knee(s).    Radiographs show evidence of joint space narrowing, osteophytes, subchondral sclerosis and/or subchondral cysts.  This patient has knee pain which interferes with functional and activities of daily living.    This patient has experienced inadequate response, adverse effects and/or intolerance with conservative treatments such as acetaminophen, NSAIDS, topical creams, physical therapy or regular exercise, knee bracing and/or weight loss.   This patient has experienced inadequate response or has a contraindication to intra articular steroid  injections for at least 3 months.   This patient is not scheduled to have a total knee replacement within 6 months of starting treatment with viscosupplementation.  Follow-Up Instructions: No follow-ups on file.   Orders:  No orders of the defined types were placed in this encounter.  Meds ordered this encounter  Medications   traMADol (ULTRAM) 50 MG tablet    Sig: Take 1-2 tablets (50-100 mg total) by mouth daily as needed.    Dispense:  20 tablet    Refill:  0   indomethacin (INDOCIN) 50 MG capsule    Sig: Take 1 capsule (50 mg total) by mouth 2 (two) times daily with a meal.    Dispense:  10 capsule    Refill:  0      Procedures: No procedures performed   Clinical Data: No additional findings.   Subjective: Chief Complaint  Patient presents with   Left Knee - Follow-up    MRI review    HPI Patient returns today to discuss left knee MRI scan as well as follow-up for left hip DJD status post cortisone injection.  She continues to have medial knee pain that is significantly limiting activities.  Her hip feels pretty good since she had the injection.  Review of Systems  Constitutional: Negative.   HENT: Negative.    Eyes: Negative.   Respiratory: Negative.    Cardiovascular: Negative.   Endocrine: Negative.   Musculoskeletal: Negative.  Neurological: Negative.   Hematological: Negative.   Psychiatric/Behavioral: Negative.    All other systems reviewed and are negative.    Objective: Vital Signs: There were no vitals taken for this visit.  Physical Exam Vitals and nursing note reviewed.  Constitutional:      Appearance: She is well-developed.  HENT:     Head: Normocephalic and atraumatic.  Pulmonary:     Effort: Pulmonary effort is normal.  Abdominal:     Palpations: Abdomen is soft.  Musculoskeletal:     Cervical back: Neck supple.  Skin:    General: Skin is warm.     Capillary Refill: Capillary refill takes less than 2 seconds.  Neurological:      Mental Status: She is alert and oriented to person, place, and time.  Psychiatric:        Behavior: Behavior normal.        Thought Content: Thought content normal.        Judgment: Judgment normal.     Ortho Exam Examination of the left hip is unchanged. Examination of the left knee does show medial joint line tenderness and bony tenderness on the medial aspect.  There is no joint effusion.  Positive varus stress instability Specialty Comments:  No specialty comments available.  Imaging: No results found.   PMFS History: Patient Active Problem List   Diagnosis Date Noted   Painful orthopaedic hardware ([place]) 09/28/2018   Closed displaced comminuted fracture of shaft of left tibia 07/01/2017   Bimalleolar ankle fracture, left, closed, initial encounter 07/01/2017   History of open reduction and internal fixation (ORIF) procedure 07/01/2017   History of breast cancer 05/24/2013   Past Medical History:  Diagnosis Date   Arthritis    "right hip; hands" (07/01/2017)   Cancer of left breast ([place]) 08/2003   S/p left breast mastectomy   Carpal tunnel syndrome, bilateral    PONV (postoperative nausea and vomiting)     Family History  Problem Relation Age of Onset   Alzheimer's disease Mother    Seizures Mother     Past Surgical History:  Procedure Laterality Date   BREAST BIOPSY Left 2005   CARPAL TUNNEL RELEASE Bilateral    COLONOSCOPY     FRACTURE SURGERY     MASTECTOMY Left 10/14/2003   MASTECTOMY WITH AXILLARY LYMPH NODE DISSECTION Left 10/14/2003   OPEN REDUCTION INTERNAL FIXATION (ORIF) TIBIA/FIBULA FRACTURE Left 07/01/2017   OPEN REDUCTION INTERNAL FIXATION (ORIF) LEFT TIBIA/PILON FRACTURE/FIBULA FRACTURE    ORIF ANKLE FRACTURE Left 07/01/2017   Procedure: OPEN REDUCTION INTERNAL FIXATION (ORIF) LEFT TIBIA/PILON FRACTURE/FIBULA FRACTURE;  Surgeon: Leandrew Koyanagi, MD;  Location: [facility];  Service: Orthopedics;  Laterality: Left;   RECONSTRUCTION BREAST W/ TRAM FLAP  Left 10/14/2003   Social History   Occupational History   Not on file  Tobacco Use   Smoking status: Never   Smokeless tobacco: Never  Vaping Use   Vaping Use: Never used  Substance and Sexual Activity   Alcohol use: Yes    Alcohol/week: 6.0 standard drinks of alcohol    Types: 6 Glasses of wine per week   Drug use: No   Sexual activity: Yes    Birth control/protection: Post-menopausal

## 2022-07-30 NOTE — Telephone Encounter (Signed)
Can we get patient approved for L knee visco injection

## 2022-08-02 NOTE — Telephone Encounter (Signed)
VOB submitted for Monovisc, left knee 

## 2022-08-05 ENCOUNTER — Other Ambulatory Visit: Payer: Self-pay | Admitting: Orthopaedic Surgery

## 2022-08-09 ENCOUNTER — Telehealth: Payer: Self-pay | Admitting: Physician Assistant

## 2022-08-09 ENCOUNTER — Other Ambulatory Visit: Payer: Self-pay | Admitting: Orthopaedic Surgery

## 2022-08-09 ENCOUNTER — Other Ambulatory Visit: Payer: Self-pay | Admitting: Physician Assistant

## 2022-08-09 MED ORDER — INDOMETHACIN 50 MG PO CAPS: 50.0000 mg | ORAL_CAPSULE | Freq: Two times a day (BID) | ORAL | 0 refills | Status: DC

## 2022-08-09 NOTE — Telephone Encounter (Signed)
Hi, the previous  message about indomethacin went away once I sent in the meds.  Could you let her know that we are sorry, but that this was the first time we heard of her needing a refill.  Can you also let her know that we do not recommend taking this long-term, but that Dr. Erlinda Hong is ok sending in one more weeks worth which I have just sent

## 2022-08-10 NOTE — Telephone Encounter (Signed)
I can also send in a different nsaid or we can do tramadol or tylenol 3.  Just let me konw

## 2022-08-24 ENCOUNTER — Encounter: Payer: Self-pay | Admitting: Orthopaedic Surgery

## 2022-08-25 NOTE — Telephone Encounter (Signed)
Is the reason she has not heard about the gel because we have to wait until the new year?  Can only let her know if that is the case thank you.    We can definitely have her see me about her hip when she comes in to get the brace if that works for her.

## 2022-09-17 ENCOUNTER — Other Ambulatory Visit: Payer: Self-pay

## 2022-09-17 DIAGNOSIS — M1712 Unilateral primary osteoarthritis, left knee: Secondary | ICD-10-CM

## 2022-09-22 ENCOUNTER — Encounter: Payer: Self-pay | Admitting: Orthopaedic Surgery

## 2022-09-22 ENCOUNTER — Ambulatory Visit (INDEPENDENT_AMBULATORY_CARE_PROVIDER_SITE_OTHER): Payer: PPO | Admitting: Orthopaedic Surgery

## 2022-09-22 DIAGNOSIS — M1712 Unilateral primary osteoarthritis, left knee: Secondary | ICD-10-CM

## 2022-09-22 MED ORDER — HYALURONAN 88 MG/4ML IX SOSY
88.0000 mg | PREFILLED_SYRINGE | INTRA_ARTICULAR | Status: AC | PRN
  Administered 2022-09-22: 88 mg via INTRA_ARTICULAR

## 2022-09-22 NOTE — Progress Notes (Signed)
Office Visit Note   Patient: [name]           Date of Birth: Apr 06, 1951           MRN: [id number] Visit Date: 09/22/2022              Requested by: Shon Baton, [place] [place],  [place] [id number] PCP: Shon Baton, MD   Assessment & Plan: Visit Diagnoses:  1. Unilateral primary osteoarthritis, left knee     Plan: Impression is left knee osteoarthritis.  Today, proceed with Monovisc injection to the left knee.  She tolerated this well.  Follow-up as needed. Lot #[id number]; expiration date 03/29/2025  Follow-Up Instructions: Return if symptoms worsen or fail to improve.   Orders:  No orders of the defined types were placed in this encounter.  No orders of the defined types were placed in this encounter.     Procedures: Large Joint Inj: L knee on 09/22/2022 4:17 PM Indications: pain Details: 22 G needle  Arthrogram: No  Medications: 88 mg Hyaluronan 88 MG/4ML Outcome: tolerated well, no immediate complications Patient was prepped and draped in the usual sterile fashion.       Clinical Data: No additional findings.   Subjective: Chief Complaint  Patient presents with   Left Knee - Follow-up    monovisc    HPI patient is a 72 year old female who comes in today for left knee Monovisc injection.  History of underlying osteoarthritis laterally to the left knee but to the left hip.  No previous viscosupplementation injection to the left knee.  Previous cortisone injection failed to provide much relief.  Scheduled to undergo left total hip replacement in the near future.     Objective: Vital Signs: There were no vitals taken for this visit.    Ortho Exam unchanged left knee exam  Specialty Comments:  No specialty comments available.  Imaging: No new imaging   PMFS History: Patient Active Problem List   Diagnosis Date Noted   Painful orthopaedic hardware ([place]) 09/28/2018   Closed displaced comminuted fracture of shaft of left tibia  07/01/2017   Bimalleolar ankle fracture, left, closed, initial encounter 07/01/2017   History of open reduction and internal fixation (ORIF) procedure 07/01/2017   History of breast cancer 05/24/2013   Past Medical History:  Diagnosis Date   Arthritis    "right hip; hands" (07/01/2017)   Cancer of left breast ([facility]) 08/2003   S/p left breast mastectomy   Carpal tunnel syndrome, bilateral    PONV (postoperative nausea and vomiting)     Family History  Problem Relation Age of Onset   Alzheimer's disease Mother    Seizures Mother     Past Surgical History:  Procedure Laterality Date   BREAST BIOPSY Left 2005   CARPAL TUNNEL RELEASE Bilateral    COLONOSCOPY     FRACTURE SURGERY     MASTECTOMY Left 10/14/2003   MASTECTOMY WITH AXILLARY LYMPH NODE DISSECTION Left 10/14/2003   OPEN REDUCTION INTERNAL FIXATION (ORIF) TIBIA/FIBULA FRACTURE Left 07/01/2017   OPEN REDUCTION INTERNAL FIXATION (ORIF) LEFT TIBIA/PILON FRACTURE/FIBULA FRACTURE    ORIF ANKLE FRACTURE Left 07/01/2017   Procedure: OPEN REDUCTION INTERNAL FIXATION (ORIF) LEFT TIBIA/PILON FRACTURE/FIBULA FRACTURE;  Surgeon: Leandrew Koyanagi, MD;  Location: [facility];  Service: Orthopedics;  Laterality: Left;   RECONSTRUCTION BREAST W/ TRAM FLAP Left 10/14/2003   Social History   Occupational History   Not on file  Tobacco Use   Smoking status: Never  Smokeless tobacco: Never  Vaping Use   Vaping Use: Never used  Substance and Sexual Activity   Alcohol use: Yes    Alcohol/week: 6.0 standard drinks of alcohol    Types: 6 Glasses of wine per week   Drug use: No   Sexual activity: Yes    Birth control/protection: Post-menopausal

## 2022-09-24 ENCOUNTER — Encounter: Payer: Self-pay | Admitting: Orthopaedic Surgery

## 2022-10-01 ENCOUNTER — Other Ambulatory Visit: Payer: Self-pay

## 2022-10-11 DIAGNOSIS — M1990 Unspecified osteoarthritis, unspecified site: Secondary | ICD-10-CM | POA: Diagnosis not present

## 2022-10-11 DIAGNOSIS — R051 Acute cough: Secondary | ICD-10-CM | POA: Diagnosis not present

## 2022-10-11 DIAGNOSIS — J40 Bronchitis, not specified as acute or chronic: Secondary | ICD-10-CM | POA: Diagnosis not present

## 2022-10-24 ENCOUNTER — Other Ambulatory Visit: Payer: Self-pay | Admitting: Physician Assistant

## 2022-10-24 ENCOUNTER — Telehealth: Payer: Self-pay | Admitting: Physician Assistant

## 2022-10-24 MED ORDER — DOCUSATE SODIUM 100 MG PO CAPS: 100.0000 mg | ORAL_CAPSULE | Freq: Every day | ORAL | 2 refills | Status: DC | PRN

## 2022-10-24 MED ORDER — METHOCARBAMOL 750 MG PO TABS: 750.0000 mg | ORAL_TABLET | Freq: Two times a day (BID) | ORAL | 2 refills | Status: DC | PRN

## 2022-10-24 MED ORDER — ONDANSETRON HCL 4 MG PO TABS: 4.0000 mg | ORAL_TABLET | Freq: Three times a day (TID) | ORAL | 0 refills | Status: DC | PRN

## 2022-10-24 MED ORDER — RIVAROXABAN 10 MG PO TABS: 10.0000 mg | ORAL_TABLET | Freq: Two times a day (BID) | ORAL | 0 refills | Status: DC

## 2022-10-24 MED ORDER — OXYCODONE-ACETAMINOPHEN 5-325 MG PO TABS: 1.0000 | ORAL_TABLET | Freq: Three times a day (TID) | ORAL | 0 refills | Status: DC | PRN

## 2022-10-24 MED ORDER — RIVAROXABAN 10 MG PO TABS: 10.0000 mg | ORAL_TABLET | Freq: Every day | ORAL | 0 refills | Status: DC

## 2022-10-25 DIAGNOSIS — J40 Bronchitis, not specified as acute or chronic: Secondary | ICD-10-CM | POA: Diagnosis not present

## 2022-10-25 NOTE — Pre-Procedure Instructions (Signed)
Surgical Instructions    Your procedure is scheduled on November 01, 2022.  Report to [facility] Main Entrance "A" at 10:00 A.M., then check in with the Admitting office.  Call this number if you have problems the morning of surgery:  [phone]  If you have any questions prior to your surgery date call [phone]: Open Monday-Friday 8am-4pm If you experience any cold or flu symptoms such as cough, fever, chills, shortness of breath, etc. between now and your scheduled surgery, please notify us at the above number.     Remember:  Do not eat after midnight the night before your surgery  You may drink clear liquids until 9:30 AM the morning of your surgery.   Clear liquids allowed are: Water, Non-Citrus Juices (without pulp), Carbonated Beverages, Clear Tea, Black Coffee Only (NO MILK, CREAM OR POWDERED CREAMER of any kind), and Gatorade.  Patient Instructions  The night before surgery:  No food after midnight. ONLY clear liquids after midnight  The day of surgery (if you do NOT have diabetes):  Drink ONE (1) Pre-Surgery Clear Ensure by 9:30 AM the morning of surgery. Drink in one sitting. Do not sip.  This drink was given to you during your hospital  pre-op appointment visit.  Nothing else to drink after completing the  Pre-Surgery Clear Ensure.         If you have questions, please contact your surgeon's office.     Take these medicines the morning of surgery with A SIP OF WATER:  loratadine (CLARITIN)   WIXELA    Take these medicines IF NEEDED:  docusate sodium (COLACE)   methocarbamol (ROBAXIN-750)   ondansetron (ZOFRAN)     As of today, STOP taking any Aspirin (unless otherwise instructed by your surgeon) Aleve, Naproxen, Ibuprofen, Motrin, Advil, Goody's, BC's, all herbal medications, fish oil, and all vitamins.                     Do NOT Smoke (Tobacco/Vaping) for 24 hours prior to your procedure.  If you use a CPAP at night, you may bring your mask/headgear for  your overnight stay.   Contacts, glasses, piercing's, hearing aid's, dentures or partials may not be worn into surgery, please bring cases for these belongings.    For patients admitted to the hospital, discharge time will be determined by your treatment team.   Patients discharged the day of surgery will not be allowed to drive home, and someone needs to stay with them for 24 hours.  SURGICAL WAITING ROOM VISITATION Patients having surgery or a procedure may have no more than 2 support people in the waiting area - these visitors may rotate.   Children under the age of 54 must have an adult with them who is not the patient. If the patient needs to stay at the hospital during part of their recovery, the visitor guidelines for inpatient rooms apply. Pre-op nurse will coordinate an appropriate time for 1 support person to accompany patient in pre-op.  This support person may not rotate.   Please refer to the [facility] website for the visitor guidelines for Inpatients (after your surgery is over and you are in a regular room).    Special instructions:   [facility]- Preparing For Surgery  Before surgery, you can play an important role. Because skin is not sterile, your skin needs to be as free of germs as possible. You can reduce the number of germs on your skin by washing with CHG (chlorahexidine  gluconate) Soap before surgery.  CHG is an antiseptic cleaner which kills germs and bonds with the skin to continue killing germs even after washing.    Oral Hygiene is also important to reduce your risk of infection.  Remember - BRUSH YOUR TEETH THE MORNING OF SURGERY WITH YOUR REGULAR TOOTHPASTE  Please do not use if you have an allergy to CHG or antibacterial soaps. If your skin becomes reddened/irritated stop using the CHG.  Do not shave (including legs and underarms) for at least 48 hours prior to first CHG shower. It is OK to shave your face.  Please follow these instructions  carefully.   Shower the NIGHT BEFORE SURGERY and the MORNING OF SURGERY  If you chose to wash your hair, wash your hair first as usual with your normal shampoo.  After you shampoo, rinse your hair and body thoroughly to remove the shampoo.  Use CHG Soap as you would any other liquid soap. You can apply CHG directly to the skin and wash gently with a scrungie or a clean washcloth.   Apply the CHG Soap to your body ONLY FROM THE NECK DOWN.  Do not use on open wounds or open sores. Avoid contact with your eyes, ears, mouth and genitals (private parts). Wash Face and genitals (private parts)  with your normal soap.   Wash thoroughly, paying special attention to the area where your surgery will be performed.  Thoroughly rinse your body with warm water from the neck down.  DO NOT shower/wash with your normal soap after using and rinsing off the CHG Soap.  Pat yourself dry with a CLEAN TOWEL.  Wear CLEAN PAJAMAS to bed the night before surgery  Place CLEAN SHEETS on your bed the night before your surgery  DO NOT SLEEP WITH PETS.   Day of Surgery: Take a shower with CHG soap. Do not wear jewelry or makeup Do not wear lotions, powders, perfumes/colognes, or deodorant. Do not shave 48 hours prior to surgery.  Men may shave face and neck. Do not bring valuables to the hospital.  [facility] is not responsible for any belongings or valuables. Do not wear nail polish, gel polish, artificial nails, or any other type of covering on natural nails (fingers and toes) If you have artificial nails or gel coating that need to be removed by a nail salon, please have this removed prior to surgery. Artificial nails or gel coating may interfere with anesthesia's ability to adequately monitor your vital signs.  Wear Clean/Comfortable clothing the morning of surgery Remember to brush your teeth WITH YOUR REGULAR TOOTHPASTE.   Please read over the following fact sheets that you were given.    If you  received a COVID test during your pre-op visit  it is requested that you wear a mask when out in public, stay away from anyone that may not be feeling well and notify your surgeon if you develop symptoms. If you have been in contact with anyone that has tested positive in the last 10 days please notify you surgeon.

## 2022-10-26 ENCOUNTER — Encounter (HOSPITAL_COMMUNITY)
Admission: RE | Admit: 2022-10-26 | Discharge: 2022-10-26 | Disposition: A | Discharge status: Home / Self Care | Payer: PPO | Source: Ambulatory Visit | Attending: Orthopaedic Surgery | Admitting: Orthopaedic Surgery

## 2022-10-26 ENCOUNTER — Encounter (HOSPITAL_COMMUNITY): Payer: Self-pay

## 2022-10-26 VITALS — BP 145/71 | HR 85 | Temp 98.0°F | Resp 16 | Ht 67.0 in | Wt 177.0 lb

## 2022-10-26 DIAGNOSIS — Z01812 Encounter for preprocedural laboratory examination: Secondary | ICD-10-CM | POA: Insufficient documentation

## 2022-10-26 DIAGNOSIS — Z01818 Encounter for other preprocedural examination: Secondary | ICD-10-CM

## 2022-10-26 DIAGNOSIS — Z96642 Presence of left artificial hip joint: Secondary | ICD-10-CM | POA: Diagnosis not present

## 2022-10-26 LAB — TYPE AND SCREEN
ABO/RH(D): A POS
Antibody Screen: NEGATIVE

## 2022-10-26 LAB — CBC
HCT: 40.4 % (ref 36.0–46.0)
Hemoglobin: 12.9 g/dL (ref 12.0–15.0)
MCH: 31.4 pg (ref 26.0–34.0)
MCHC: 31.9 g/dL (ref 30.0–36.0)
MCV: 98.3 fL (ref 80.0–100.0)
Platelets: 393 10*3/uL (ref 150–400)
RBC: 4.11 MIL/uL (ref 3.87–5.11)
RDW: 12.4 % (ref 11.5–15.5)
WBC: 11.3 10*3/uL — ABNORMAL HIGH (ref 4.0–10.5)
nRBC: 0 % (ref 0.0–0.2)

## 2022-10-26 LAB — BASIC METABOLIC PANEL
Anion gap: 10 (ref 5–15)
BUN: 9 mg/dL (ref 8–23)
CO2: 28 mmol/L (ref 22–32)
Calcium: 9.8 mg/dL (ref 8.9–10.3)
Chloride: 101 mmol/L (ref 98–111)
Creatinine, Ser: 0.73 mg/dL (ref 0.44–1.00)
GFR, Estimated: >60 mL/min (ref >60)
Glucose, Bld: 108 mg/dL — ABNORMAL HIGH (ref 70–99)
Potassium: 4.4 mmol/L (ref 3.5–5.1)
Sodium: 139 mmol/L (ref 135–145)

## 2022-10-26 LAB — SURGICAL PCR SCREEN
MRSA, PCR: NEGATIVE
Staphylococcus aureus: NEGATIVE

## 2022-10-26 NOTE — Progress Notes (Signed)
PCP - Shon Baton  Cardiologist - Denies  PPM/ICD - Denies  Chest x-ray - NI EKG - NI Stress Test - Denies ECHO - DEnies Cardiac Cath - Denies  Sleep Study - Denies  DM - Denies  Blood Thinner Instructions: N/A  Aspirin Instructions: N/A  ERAS Protcol -Yes PRE-SURGERY Ensure given  COVID TEST- N/A   Anesthesia review: Yes cough x 4 weeks seeing PCP for mgt abx Cefdinir started  Patient denies shortness of breath, fever, and chest pain at PAT appointment  Patient had Covid in September of 2023.  Now having a cough x 4 weeks.  Seeing Dr. Virgina Jock for management of the cough.  He has done two CXR with the last being on 10/25/22.  Was in [place] on 10/13/22, had the cough before the trip.    All instructions explained to the patient, with a verbal understanding of the material. Patient agrees to go over the instructions while at home for a better understanding.  The opportunity to ask questions was provided.

## 2022-10-26 NOTE — Progress Notes (Signed)
Anesthesia Chart Review:  Pt reports mildly productive cough for ~6 weeks. She initially was seen by her PCP Dr. Virgina Jock and treated with a Z-pak with minimal improvement. She was seen in followup on 10/11/22 and a CXR was done which showed some equivocal hilar abnormality. Supportive care was recommended. At that time pt traveled to [place] for 2 weeks and states she felt the cough may have improved a little bit. She was able to be active without any limitation but continued to have persistent coughing. She was seen in followup by Dr. Virgina Jock on on 10/25/22 when she returned. At that time repeat CXR was done which showed some improvement in the previously noted hilar opacity. Dr. Virgina Jock commented that she was likely dealing with the sequelae of viral bronchitis but given her persistent symptoms and upcoming surgery he did prescribed a 7 day course of Cefdinir. He did state he felt she was okay to proceed with THA.   She does continue to have bouts of minimally productive coughing. She denies any associated symptoms, no rhinorrhea, no SOB, no fever, no HA, no sinus congestion. Says she feels well overall. States she went to her water aerobics class this morning and was able to exercise without issue. On my exam today the pt is well appearing, in NAD. She does have brief paroxysms of coughing, however her lungs are clear bilaterally.  I reviewed the case with anesthesiologist Dr. Gifford Shave. He advised she could proceed with surgery as planned as long as there is no worsening of her symptoms. She will have DOS exam by assigned anesthesiologist.

## 2022-10-26 NOTE — Progress Notes (Signed)
Thanks for the update

## 2022-10-28 NOTE — Anesthesia Preprocedure Evaluation (Addendum)
Anesthesia Evaluation  Patient identified by MRN, date of birth, ID band Patient awake    Reviewed: Allergy & Precautions, NPO status , Patient's Chart, lab work & pertinent test results  History of Anesthesia Complications (+) PONV and history of anesthetic complications  Airway Mallampati: II  TM Distance: >3 FB Neck ROM: Full    Dental  (+) Teeth Intact, Dental Advisory Given   Pulmonary neg pulmonary ROS   breath sounds clear to auscultation       Cardiovascular negative cardio ROS  Rhythm:Regular     Neuro/Psych  Neuromuscular disease  negative psych ROS   GI/Hepatic negative GI ROS, Neg liver ROS,,,  Endo/Other  negative endocrine ROS    Renal/GU negative Renal ROS     Musculoskeletal  (+) Arthritis ,    Abdominal   Peds  Hematology negative hematology ROS (+)   Anesthesia Other Findings   Reproductive/Obstetrics                             Anesthesia Physical Anesthesia Plan  ASA: 2  Anesthesia Plan: MAC and Spinal   Post-op Pain Management:    Induction: Intravenous  PONV Risk Score and Plan: 3 and Propofol infusion, Treatment may vary due to age or medical condition and Ondansetron  Airway Management Planned: Nasal Cannula and Natural Airway  Additional Equipment: None  Intra-op Plan:   Post-operative Plan:   Informed Consent: I have reviewed the patients History and Physical, chart, labs and discussed the procedure including the risks, benefits and alternatives for the proposed anesthesia with the patient or authorized representative who has indicated his/her understanding and acceptance.     Dental advisory given  Plan Discussed with: CRNA  Anesthesia Plan Comments: (PAT note by Karoline Caldwell, PA-C: 72 year old female with pertinent history including PONV, breast cancer s/p left mastectomy with axillary lymph node dissection 2005, bilateral carpal tunnel  syndrome, arthritis.  Pt reports mildly productive cough for ~6 weeks. She initially was seen by her PCP Dr. Virgina Jock and treated with a Z-pak with minimal improvement. She was seen in followup on 10/11/22 and a CXR was done which showed some equivocal hilar abnormality (pt was able to show me imaging reports on her phone). Supportive care was recommended. At that time pt traveled to [place] for 2 weeks and states she felt the cough may have improved a little bit. She was able to be active without any limitation but the cough did not completely resolve. She was seen in followup by Dr. Virgina Jock on on 10/25/22 when she returned. At that time repeat CXR was done which showed some improvement in the previously noted hilar opacity. Dr. Virgina Jock commented that she was likely dealing with the sequelae of viral bronchitis but given her persistent symptoms and upcoming surgery he did prescribed a 7 day course of Cefdinir. He did state he felt she was okay to proceed with THA.   She does continue to have bouts of minimally productive coughing. She denies any associated symptoms, no rhinorrhea, no SOB, no fever, no HA, no sinus congestion. Says she feels well overall. States she went to her water aerobics class this morning and was able to exercise without issue. On my exam today the pt is well appearing, in NAD. She does have brief paroxysms of coughing, however her lungs are clear bilaterally.  I reviewed the case with anesthesiologist Dr. Gifford Shave. He advised she could proceed with surgery as planned as long  as there is no worsening of her symptoms. She will have DOS exam by assigned anesthesiologist.   Preop labs reviewed, unremarkable.   )        Anesthesia Quick Evaluation

## 2022-10-29 ENCOUNTER — Telehealth: Payer: Self-pay | Admitting: *Deleted

## 2022-10-29 ENCOUNTER — Other Ambulatory Visit: Payer: Self-pay | Admitting: *Deleted

## 2022-10-29 MED ORDER — TRANEXAMIC ACID 1000 MG/10ML IV SOLN
2000.0000 mg | INTRAVENOUS | Status: DC
  Filled 2022-10-29 (×2): qty 20

## 2022-10-29 NOTE — Progress Notes (Signed)
Error

## 2022-10-29 NOTE — Care Plan (Signed)
[facility] RNCM call to patient to discuss her upcoming Left total hip arthroplasty with Dr. Erlinda Hong on 11/01/22. She is an Ortho bundle through [facility] and is agreeable to case management. She lives with her spouse, who will be assisting at discharge. She has a standard walker and another 4 wheeled walker that she has borrowed from church, but unsure if those would be acceptable. Will discuss in hospital. She has a 3in1/BSC already as well. Anticipate HHPT will be needed after a short hospital stay. Referral made to [facility], but they were unable to staff. Will make referral to another agency. Reviewed all post op care instructions. Will continue to follow for needs.

## 2022-10-29 NOTE — Telephone Encounter (Signed)
Ortho bundle pre-op call completed. 

## 2022-10-31 DIAGNOSIS — M1612 Unilateral primary osteoarthritis, left hip: Secondary | ICD-10-CM | POA: Insufficient documentation

## 2022-11-01 ENCOUNTER — Ambulatory Visit (HOSPITAL_COMMUNITY): Payer: PPO

## 2022-11-01 ENCOUNTER — Ambulatory Visit (HOSPITAL_BASED_OUTPATIENT_CLINIC_OR_DEPARTMENT_OTHER): Payer: PPO | Admitting: Anesthesiology

## 2022-11-01 ENCOUNTER — Observation Stay (HOSPITAL_COMMUNITY): Payer: PPO

## 2022-11-01 ENCOUNTER — Encounter (HOSPITAL_COMMUNITY)
Admission: RE | Disposition: A | Discharge status: Home Health | Payer: Self-pay | Source: Ambulatory Visit | Attending: Orthopaedic Surgery

## 2022-11-01 ENCOUNTER — Ambulatory Visit (HOSPITAL_COMMUNITY): Payer: PPO | Admitting: Physician Assistant

## 2022-11-01 ENCOUNTER — Observation Stay (HOSPITAL_COMMUNITY)
Admission: RE | Admit: 2022-11-01 | Discharge: 2022-11-02 | Disposition: A | Discharge status: Home Health | Payer: PPO | Source: Ambulatory Visit | Attending: Orthopaedic Surgery | Admitting: Orthopaedic Surgery

## 2022-11-01 ENCOUNTER — Other Ambulatory Visit: Payer: Self-pay

## 2022-11-01 ENCOUNTER — Encounter (HOSPITAL_COMMUNITY): Payer: Self-pay | Admitting: Orthopaedic Surgery

## 2022-11-01 DIAGNOSIS — Z853 Personal history of malignant neoplasm of breast: Secondary | ICD-10-CM | POA: Insufficient documentation

## 2022-11-01 DIAGNOSIS — M1612 Unilateral primary osteoarthritis, left hip: Secondary | ICD-10-CM | POA: Diagnosis not present

## 2022-11-01 DIAGNOSIS — Z471 Aftercare following joint replacement surgery: Secondary | ICD-10-CM | POA: Diagnosis not present

## 2022-11-01 DIAGNOSIS — Z96642 Presence of left artificial hip joint: Secondary | ICD-10-CM | POA: Diagnosis not present

## 2022-11-01 HISTORY — PX: TOTAL HIP ARTHROPLASTY: SHX124

## 2022-11-01 LAB — ABO/RH: ABO/RH(D): A POS

## 2022-11-01 SURGERY — ARTHROPLASTY, HIP, TOTAL, ANTERIOR APPROACH
Anesthesia: Monitor Anesthesia Care | Site: Hip | Laterality: Left

## 2022-11-01 MED ORDER — LACTATED RINGERS IV SOLN: INTRAVENOUS | Status: DC

## 2022-11-01 MED ORDER — SODIUM CHLORIDE 0.9 % IR SOLN
Status: DC | PRN
  Administered 2022-11-01: 1000 mL

## 2022-11-01 MED ORDER — MENTHOL 3 MG MT LOZG: 1.0000 | LOZENGE | OROMUCOSAL | Status: DC | PRN

## 2022-11-01 MED ORDER — BUPIVACAINE-MELOXICAM ER 400-12 MG/14ML IJ SOLN
INTRAMUSCULAR | Status: AC
  Filled 2022-11-01: qty 1

## 2022-11-01 MED ORDER — MAGNESIUM CITRATE PO SOLN: 1.0000 | Freq: Once | ORAL | Status: DC | PRN

## 2022-11-01 MED ORDER — CEFAZOLIN SODIUM-DEXTROSE 2-4 GM/100ML-% IV SOLN
2.0000 g | INTRAVENOUS | Status: AC
  Administered 2022-11-01: 2 g via INTRAVENOUS
  Filled 2022-11-01: qty 100

## 2022-11-01 MED ORDER — BUPIVACAINE-MELOXICAM ER 400-12 MG/14ML IJ SOLN
INTRAMUSCULAR | Status: DC | PRN
  Administered 2022-11-01: 400 mg

## 2022-11-01 MED ORDER — METOCLOPRAMIDE HCL 5 MG/ML IJ SOLN: 5.0000 mg | Freq: Three times a day (TID) | INTRAMUSCULAR | Status: DC | PRN

## 2022-11-01 MED ORDER — FERROUS SULFATE 325 (65 FE) MG PO TABS
325.0000 mg | ORAL_TABLET | Freq: Three times a day (TID) | ORAL | Status: DC
  Filled 2022-11-01 (×2): qty 1

## 2022-11-01 MED ORDER — LIDOCAINE 2% (20 MG/ML) 5 ML SYRINGE
INTRAMUSCULAR | Status: AC
  Filled 2022-11-01: qty 5

## 2022-11-01 MED ORDER — ONDANSETRON HCL 4 MG/2ML IJ SOLN
INTRAMUSCULAR | Status: AC
  Filled 2022-11-01: qty 2

## 2022-11-01 MED ORDER — HYDROMORPHONE HCL 1 MG/ML IJ SOLN: 0.5000 mg | INTRAMUSCULAR | Status: DC | PRN

## 2022-11-01 MED ORDER — PROPOFOL 10 MG/ML IV BOLUS
INTRAVENOUS | Status: AC
  Filled 2022-11-01: qty 20

## 2022-11-01 MED ORDER — OXYCODONE HCL 5 MG PO TABS
5.0000 mg | ORAL_TABLET | Freq: Once | ORAL | Status: AC | PRN
  Administered 2022-11-01: 5 mg via ORAL

## 2022-11-01 MED ORDER — ORAL CARE MOUTH RINSE: 15.0000 mL | OROMUCOSAL | Status: DC | PRN

## 2022-11-01 MED ORDER — POLYETHYLENE GLYCOL 3350 17 G PO PACK
17.0000 g | PACK | Freq: Every day | ORAL | Status: DC
  Administered 2022-11-02: 17 g via ORAL
  Filled 2022-11-01: qty 1

## 2022-11-01 MED ORDER — SODIUM CHLORIDE 0.9 % IV SOLN
INTRAVENOUS | Status: DC
  Administered 2022-11-01: 900 mL via INTRAVENOUS

## 2022-11-01 MED ORDER — PRONTOSAN WOUND IRRIGATION OPTIME
TOPICAL | Status: DC | PRN
  Administered 2022-11-01: 350 mL via TOPICAL

## 2022-11-01 MED ORDER — PROPOFOL 10 MG/ML IV BOLUS
INTRAVENOUS | Status: DC | PRN
  Administered 2022-11-01 (×3): 20 mg via INTRAVENOUS

## 2022-11-01 MED ORDER — ACETAMINOPHEN 160 MG/5ML PO SOLN: 1000.0000 mg | Freq: Once | ORAL | Status: DC | PRN

## 2022-11-01 MED ORDER — VANCOMYCIN HCL 1000 MG IV SOLR
INTRAVENOUS | Status: AC
  Filled 2022-11-01: qty 20

## 2022-11-01 MED ORDER — ALUM & MAG HYDROXIDE-SIMETH 200-200-20 MG/5ML PO SUSP: 30.0000 mL | ORAL | Status: DC | PRN

## 2022-11-01 MED ORDER — METOCLOPRAMIDE HCL 5 MG PO TABS: 5.0000 mg | ORAL_TABLET | Freq: Three times a day (TID) | ORAL | Status: DC | PRN

## 2022-11-01 MED ORDER — ONDANSETRON HCL 4 MG/2ML IJ SOLN
4.0000 mg | Freq: Four times a day (QID) | INTRAMUSCULAR | Status: DC | PRN
  Administered 2022-11-01: 4 mg via INTRAVENOUS
  Filled 2022-11-01: qty 2

## 2022-11-01 MED ORDER — LIDOCAINE 2% (20 MG/ML) 5 ML SYRINGE
INTRAMUSCULAR | Status: DC | PRN
  Administered 2022-11-01: 50 mg via INTRAVENOUS

## 2022-11-01 MED ORDER — CHLORHEXIDINE GLUCONATE 0.12 % MT SOLN
15.0000 mL | Freq: Once | OROMUCOSAL | Status: AC
  Administered 2022-11-01: 15 mL via OROMUCOSAL
  Filled 2022-11-01: qty 15

## 2022-11-01 MED ORDER — TRANEXAMIC ACID-NACL 1000-0.7 MG/100ML-% IV SOLN: 1000.0000 mg | Freq: Once | INTRAVENOUS | Status: DC

## 2022-11-01 MED ORDER — OXYCODONE HCL 5 MG PO TABS: 10.0000 mg | ORAL_TABLET | ORAL | Status: DC | PRN

## 2022-11-01 MED ORDER — FENTANYL CITRATE (PF) 100 MCG/2ML IJ SOLN
25.0000 ug | INTRAMUSCULAR | Status: DC | PRN
  Administered 2022-11-01 (×2): 25 ug via INTRAVENOUS
  Administered 2022-11-01 (×2): 50 ug via INTRAVENOUS

## 2022-11-01 MED ORDER — OXYCODONE HCL 5 MG PO TABS
ORAL_TABLET | ORAL | Status: AC
  Filled 2022-11-01: qty 1

## 2022-11-01 MED ORDER — OXYCODONE HCL 5 MG/5ML PO SOLN: 5.0000 mg | Freq: Once | ORAL | Status: AC | PRN

## 2022-11-01 MED ORDER — PHENOL 1.4 % MT LIQD: 1.0000 | OROMUCOSAL | Status: DC | PRN

## 2022-11-01 MED ORDER — 0.9 % SODIUM CHLORIDE (POUR BTL) OPTIME
TOPICAL | Status: DC | PRN
  Administered 2022-11-01: 1000 mL

## 2022-11-01 MED ORDER — DOCUSATE SODIUM 100 MG PO CAPS
100.0000 mg | ORAL_CAPSULE | Freq: Two times a day (BID) | ORAL | Status: DC
  Administered 2022-11-01 – 2022-11-02 (×2): 100 mg via ORAL
  Filled 2022-11-01 (×2): qty 1

## 2022-11-01 MED ORDER — ACETAMINOPHEN 500 MG PO TABS
1000.0000 mg | ORAL_TABLET | Freq: Four times a day (QID) | ORAL | Status: DC
  Administered 2022-11-01 – 2022-11-02 (×3): 1000 mg via ORAL
  Filled 2022-11-01 (×2): qty 2

## 2022-11-01 MED ORDER — DEXAMETHASONE SODIUM PHOSPHATE 10 MG/ML IJ SOLN
10.0000 mg | Freq: Once | INTRAMUSCULAR | Status: AC
  Administered 2022-11-02: 10 mg via INTRAVENOUS
  Filled 2022-11-01: qty 1

## 2022-11-01 MED ORDER — METHOCARBAMOL 1000 MG/10ML IJ SOLN
500.0000 mg | Freq: Four times a day (QID) | INTRAVENOUS | Status: DC | PRN
  Filled 2022-11-01: qty 5

## 2022-11-01 MED ORDER — POVIDONE-IODINE 10 % EX SWAB
2.0000 | Freq: Once | CUTANEOUS | Status: AC
  Administered 2022-11-01: 2 via TOPICAL

## 2022-11-01 MED ORDER — STERILE WATER FOR IRRIGATION IR SOLN
Status: DC | PRN
  Administered 2022-11-01: 1000 mL

## 2022-11-01 MED ORDER — ORAL CARE MOUTH RINSE: 15.0000 mL | Freq: Once | OROMUCOSAL | Status: AC

## 2022-11-01 MED ORDER — TRANEXAMIC ACID-NACL 1000-0.7 MG/100ML-% IV SOLN
1000.0000 mg | INTRAVENOUS | Status: AC
  Administered 2022-11-01: 1000 mg via INTRAVENOUS
  Filled 2022-11-01: qty 100

## 2022-11-01 MED ORDER — ONDANSETRON HCL 4 MG PO TABS: 4.0000 mg | ORAL_TABLET | Freq: Four times a day (QID) | ORAL | Status: DC | PRN

## 2022-11-01 MED ORDER — ACETAMINOPHEN 500 MG PO TABS: 1000.0000 mg | ORAL_TABLET | Freq: Once | ORAL | Status: DC | PRN

## 2022-11-01 MED ORDER — PROPOFOL 500 MG/50ML IV EMUL
INTRAVENOUS | Status: DC | PRN
  Administered 2022-11-01: 75 ug/kg/min via INTRAVENOUS

## 2022-11-01 MED ORDER — OXYCODONE HCL ER 10 MG PO T12A
10.0000 mg | EXTENDED_RELEASE_TABLET | Freq: Two times a day (BID) | ORAL | Status: DC
  Administered 2022-11-01 – 2022-11-02 (×2): 10 mg via ORAL
  Filled 2022-11-01 (×2): qty 1

## 2022-11-01 MED ORDER — ASPIRIN 81 MG PO CHEW
81.0000 mg | CHEWABLE_TABLET | Freq: Two times a day (BID) | ORAL | Status: DC
  Administered 2022-11-01 – 2022-11-02 (×2): 81 mg via ORAL
  Filled 2022-11-01 (×2): qty 1

## 2022-11-01 MED ORDER — FENTANYL CITRATE (PF) 250 MCG/5ML IJ SOLN
INTRAMUSCULAR | Status: DC | PRN
  Administered 2022-11-01: 25 ug via INTRAVENOUS
  Administered 2022-11-01: 50 ug via INTRAVENOUS
  Administered 2022-11-01 (×3): 25 ug via INTRAVENOUS

## 2022-11-01 MED ORDER — METHOCARBAMOL 500 MG PO TABS
500.0000 mg | ORAL_TABLET | Freq: Four times a day (QID) | ORAL | Status: DC | PRN
  Administered 2022-11-01: 500 mg via ORAL
  Filled 2022-11-01: qty 1

## 2022-11-01 MED ORDER — TRANEXAMIC ACID 1000 MG/10ML IV SOLN
INTRAVENOUS | Status: DC | PRN
  Administered 2022-11-01: 2000 mg via TOPICAL

## 2022-11-01 MED ORDER — OXYCODONE HCL 5 MG PO TABS
5.0000 mg | ORAL_TABLET | ORAL | Status: DC | PRN
  Administered 2022-11-01: 10 mg via ORAL
  Filled 2022-11-01: qty 2

## 2022-11-01 MED ORDER — SORBITOL 70 % SOLN: 30.0000 mL | Freq: Every day | Status: DC | PRN

## 2022-11-01 MED ORDER — ACETAMINOPHEN 10 MG/ML IV SOLN
1000.0000 mg | Freq: Once | INTRAVENOUS | Status: DC | PRN
  Administered 2022-11-01: 1000 mg via INTRAVENOUS

## 2022-11-01 MED ORDER — PANTOPRAZOLE SODIUM 40 MG PO TBEC
40.0000 mg | DELAYED_RELEASE_TABLET | Freq: Every day | ORAL | Status: DC
  Administered 2022-11-01 – 2022-11-02 (×2): 40 mg via ORAL
  Filled 2022-11-01 (×2): qty 1

## 2022-11-01 MED ORDER — ONDANSETRON HCL 4 MG/2ML IJ SOLN
INTRAMUSCULAR | Status: DC | PRN
  Administered 2022-11-01: 4 mg via INTRAVENOUS

## 2022-11-01 MED ORDER — ACETAMINOPHEN 10 MG/ML IV SOLN
INTRAVENOUS | Status: AC
  Filled 2022-11-01: qty 100

## 2022-11-01 MED ORDER — FENTANYL CITRATE (PF) 100 MCG/2ML IJ SOLN
INTRAMUSCULAR | Status: AC
  Filled 2022-11-01: qty 2

## 2022-11-01 MED ORDER — FENTANYL CITRATE (PF) 250 MCG/5ML IJ SOLN
INTRAMUSCULAR | Status: AC
  Filled 2022-11-01: qty 5

## 2022-11-01 MED ORDER — VANCOMYCIN HCL 1 G IV SOLR
INTRAVENOUS | Status: DC | PRN
  Administered 2022-11-01: 1000 mg via TOPICAL

## 2022-11-01 MED ORDER — PHENYLEPHRINE HCL-NACL 20-0.9 MG/250ML-% IV SOLN
INTRAVENOUS | Status: DC | PRN
  Administered 2022-11-01: 50 ug/min via INTRAVENOUS

## 2022-11-01 MED ORDER — CEFAZOLIN SODIUM-DEXTROSE 2-4 GM/100ML-% IV SOLN
2.0000 g | Freq: Four times a day (QID) | INTRAVENOUS | Status: AC
  Administered 2022-11-01 (×2): 2 g via INTRAVENOUS
  Filled 2022-11-01 (×2): qty 100

## 2022-11-01 MED ORDER — DIPHENHYDRAMINE HCL 12.5 MG/5ML PO ELIX: 25.0000 mg | ORAL_SOLUTION | ORAL | Status: DC | PRN

## 2022-11-01 MED ORDER — ACETAMINOPHEN 325 MG PO TABS: 325.0000 mg | ORAL_TABLET | Freq: Four times a day (QID) | ORAL | Status: DC | PRN

## 2022-11-01 SURGICAL SUPPLY — 68 items
ADH SKN CLS APL DERMABOND .7 (GAUZE/BANDAGES/DRESSINGS) ×1
BAG COUNTER SPONGE SURGICOUNT (BAG) ×1 IMPLANT
BAG DECANTER FOR FLEXI CONT (MISCELLANEOUS) ×1 IMPLANT
BAG SPNG CNTER NS LX DISP (BAG) ×1
BLADE SAG 18X100X1.27 (BLADE) ×1 IMPLANT
COOLER ICEMAN CLASSIC (MISCELLANEOUS) IMPLANT
COVER PERINEAL POST (MISCELLANEOUS) ×1 IMPLANT
COVER SURGICAL LIGHT HANDLE (MISCELLANEOUS) ×1 IMPLANT
DERMABOND ADVANCED .7 DNX12 (GAUZE/BANDAGES/DRESSINGS) IMPLANT
DRAPE C-ARM 42X72 X-RAY (DRAPES) ×1 IMPLANT
DRAPE IMP U-DRAPE 54X76 (DRAPES) IMPLANT
DRAPE POUCH INSTRU U-SHP 10X18 (DRAPES) ×1 IMPLANT
DRAPE STERI IOBAN 125X83 (DRAPES) ×1 IMPLANT
DRAPE U-SHAPE 47X51 STRL (DRAPES) ×2 IMPLANT
DRSG AQUACEL AG ADV 3.5X10 (GAUZE/BANDAGES/DRESSINGS) ×1 IMPLANT
DURAPREP 26ML APPLICATOR (WOUND CARE) ×2 IMPLANT
ELECT BLADE 4.0 EZ CLEAN MEGAD (MISCELLANEOUS) ×1
ELECT REM PT RETURN 9FT ADLT (ELECTROSURGICAL) ×1
ELECTRODE BLDE 4.0 EZ CLN MEGD (MISCELLANEOUS) ×1 IMPLANT
ELECTRODE REM PT RTRN 9FT ADLT (ELECTROSURGICAL) ×1 IMPLANT
GLOVE BIOGEL PI IND STRL 7.0 (GLOVE) ×2 IMPLANT
GLOVE BIOGEL PI IND STRL 7.5 (GLOVE) ×5 IMPLANT
GLOVE ECLIPSE 7.0 STRL STRAW (GLOVE) ×2 IMPLANT
GLOVE SKINSENSE STRL SZ7.5 (GLOVE) ×1 IMPLANT
GLOVE SURG SYN 7.5  E (GLOVE) ×2
GLOVE SURG SYN 7.5 E (GLOVE) ×2 IMPLANT
GLOVE SURG SYN 7.5 PF PI (GLOVE) ×2 IMPLANT
GLOVE SURG UNDER POLY LF SZ7 (GLOVE) ×3 IMPLANT
GLOVE SURG UNDER POLY LF SZ7.5 (GLOVE) ×2 IMPLANT
GOWN STRL REIN XL XLG (GOWN DISPOSABLE) ×1 IMPLANT
GOWN STRL REUS W/ TWL LRG LVL3 (GOWN DISPOSABLE) IMPLANT
GOWN STRL REUS W/ TWL XL LVL3 (GOWN DISPOSABLE) ×1 IMPLANT
GOWN STRL REUS W/TWL LRG LVL3 (GOWN DISPOSABLE)
GOWN STRL REUS W/TWL XL LVL3 (GOWN DISPOSABLE) ×1
GOWN TOGA ZIPPER T7+ PEEL AWAY (MISCELLANEOUS) ×2 IMPLANT
HANDPIECE INTERPULSE COAX TIP (DISPOSABLE) ×1
HEAD CERAMIC DELTA 36 PLUS 1.5 (Hips) IMPLANT
HOOD PEEL AWAY T7 (MISCELLANEOUS) ×1 IMPLANT
IV NS IRRIG 3000ML ARTHROMATIC (IV SOLUTION) ×1 IMPLANT
KIT BASIN OR (CUSTOM PROCEDURE TRAY) ×1 IMPLANT
LINER NEUTRAL 52X36MM PLUS 4 (Liner) IMPLANT
MARKER SKIN DUAL TIP RULER LAB (MISCELLANEOUS) ×1 IMPLANT
NDL SPNL 18GX3.5 QUINCKE PK (NEEDLE) ×1 IMPLANT
NEEDLE SPNL 18GX3.5 QUINCKE PK (NEEDLE) ×1 IMPLANT
PACK TOTAL JOINT (CUSTOM PROCEDURE TRAY) ×1 IMPLANT
PACK UNIVERSAL I (CUSTOM PROCEDURE TRAY) ×1 IMPLANT
PAD COLD SHLDR WRAP-ON (PAD) IMPLANT
PIN SECTOR W/GRIP ACE CUP 52MM (Hips) IMPLANT
SCREW 6.5MMX25MM (Screw) IMPLANT
SET HNDPC FAN SPRY TIP SCT (DISPOSABLE) ×1 IMPLANT
SOLUTION PRONTOSAN WOUND 350ML (IRRIGATION / IRRIGATOR) ×1 IMPLANT
STAPLER VISISTAT 35W (STAPLE) IMPLANT
STEM FEM ACTIS STD SZ4 (Stem) IMPLANT
SUT ETHIBOND 2 V 37 (SUTURE) ×1 IMPLANT
SUT ETHILON 2 0 PSLX (SUTURE) IMPLANT
SUT VIC AB 0 CT1 27 (SUTURE) ×1
SUT VIC AB 0 CT1 27XBRD ANBCTR (SUTURE) ×1 IMPLANT
SUT VIC AB 1 CTX 36 (SUTURE) ×1
SUT VIC AB 1 CTX36XBRD ANBCTR (SUTURE) ×1 IMPLANT
SUT VIC AB 2-0 CT1 27 (SUTURE) ×2
SUT VIC AB 2-0 CT1 TAPERPNT 27 (SUTURE) ×2 IMPLANT
SYR 50ML LL SCALE MARK (SYRINGE) ×1 IMPLANT
TOWEL GREEN STERILE (TOWEL DISPOSABLE) ×1 IMPLANT
TRAY CATH 16FR W/PLASTIC CATH (SET/KITS/TRAYS/PACK) IMPLANT
TRAY FOLEY W/BAG SLVR 16FR (SET/KITS/TRAYS/PACK) ×1
TRAY FOLEY W/BAG SLVR 16FR ST (SET/KITS/TRAYS/PACK) IMPLANT
TUBE SUCT ARGYLE STRL (TUBING) ×1 IMPLANT
YANKAUER SUCT BULB TIP NO VENT (SUCTIONS) ×1 IMPLANT

## 2022-11-01 NOTE — Transfer of Care (Signed)
Immediate Anesthesia Transfer of Care Note  Patient: [name]  Procedure(s) Performed: LEFT TOTAL HIP ARTHROPLASTY ANTERIOR APPROACH (Left: Hip)  Patient Location: PACU  Anesthesia Type:Spinal  Level of Consciousness: awake, alert , and oriented  Airway & Oxygen Therapy: Patient Spontanous Breathing  Post-op Assessment: Report given to RN, Post -op Vital signs reviewed and stable, and Patient moving all extremities X 4  Post vital signs: Reviewed and stable  Last Vitals:  Vitals Value Taken Time  BP 118/64 11/01/22 1439  Temp    Pulse 60 11/01/22 1442  Resp 15 11/01/22 1442  SpO2 94 % 11/01/22 1442  Vitals shown include unvalidated device data.  Last Pain:  Vitals:   11/01/22 1033  TempSrc:   PainSc: 3       Patients Stated Pain Goal: 0 ([id number])  Complications: No notable events documented.

## 2022-11-01 NOTE — Discharge Instructions (Signed)

## 2022-11-01 NOTE — Op Note (Signed)
LEFT TOTAL HIP ARTHROPLASTY ANTERIOR APPROACH  Procedure Note [name]   [id number]  Pre-op Diagnosis: left hip osteoarthritis     Post-op Diagnosis: same  Operative Findings Collapse of left femoral head, severe DJD, possible AVN   Operative Procedures  1. Total hip replacement; Left hip; uncemented [id number]   Surgeon: Frankey Shown, M.D.  Assist: Madalyn Rob, PA-C   Anesthesia: spinal  Prosthesis: Depuy Acetabulum: Pinnacle 52 mm Femur: Actis 4 STD Head: 36 mm size: +1.5 Liner: +4 Bearing Type: ceramic/poly  Total Hip Arthroplasty (Anterior Approach) Op Note:  After informed consent was obtained and the operative extremity marked in the holding area, the patient was brought back to the operating room and placed supine on the HANA table. Next, the operative extremity was prepped and draped in normal sterile fashion. Surgical timeout occurred verifying patient identification, surgical site, surgical procedure and administration of antibiotics.  A Hueter approach to the hip was performed, using the interval between tensor fascia lata and sartorius.  Dissection was carried bluntly down onto the anterior hip capsule. The lateral femoral circumflex vessels were identified and coagulated. A capsulotomy was performed and the capsular flaps tagged for later repair.  The neck osteotomy was performed. The femoral head was removed which showed severe wear, collapse of femoral head, the acetabular rim was cleared of soft tissue and osteophytes and attention was turned to reaming the acetabulum.  Sequential reaming was performed under fluoroscopic guidance down to the floor of the cotyloid fossa. We reamed to a size 51 mm, and then impacted the acetabular shell. A 25 mm cancellous screw was placed through the shell for added fixation.  The liner was then placed after irrigation and attention turned to the femur.  After placing the femoral hook, the leg was taken to externally  rotated, extended and adducted position taking care to perform soft tissue releases to allow for adequate mobilization of the femur. Soft tissue was cleared from the shoulder of the greater trochanter and the hook elevator used to improve exposure of the proximal femur. Sequential broaching performed up to a size 4. Trial neck and head were placed. The leg was brought back up to neutral and the construct reduced.  Antibiotic irrigation was placed in the surgical wound.  The position and sizing of components, offset and leg lengths were checked using fluoroscopy. Stability of the construct was checked in extension and external rotation without any subluxation, shuck or impingement of prosthesis. As we rotated the hip from 90 to 0 degrees, I would feel a clunk around the greater trochanter which I discovered was due to the IT band snapping over the greater trochanter.  This did not cause any problems with stability of the joint.  We dislocated the prosthesis, dropped the leg back into position, removed trial components, and irrigated copiously. The final stem and head was then placed, the leg brought back up, the system reduced and fluoroscopy used to verify positioning.  We irrigated, obtained hemostasis and closed the capsule using #2 ethibond suture.  One gram of vancomycin powder was placed in the surgical bed.   One gram of topical tranexamic acid was injected into the joint.  The fascia was closed with #1 vicryl plus, the deep fat layer was closed with 0 vicryl, the subcutaneous layers closed with 2.0 Vicryl Plus and the skin closed with 2.0 nylon and dermabond. A sterile dressing was applied. The patient was awakened in the operating room and taken to recovery in stable  condition.  All sponge, needle, and instrument counts were correct at the end of the case.   [name], my PA, was a medical necessity for opening, closing, limb positioning, retracting, exposing, and overall facilitation and timely  completion of the surgery.  Position: supine  Complications: see description of procedure.  Time Out: performed   Drains/Packing: none  Estimated blood loss: see anesthesia record  Returned to Recovery Room: in good condition.   Antibiotics: yes   Mechanical VTE (DVT) Prophylaxis: sequential compression devices, TED thigh-high  Chemical VTE (DVT) Prophylaxis: aspirin   Fluid Replacement: see anesthesia record  Specimens Removed: 1 to pathology   Sponge and Instrument Count Correct? yes   PACU: portable radiograph - low AP   Plan/RTC: Return in 2 weeks for staple removal. Weight Bearing/Load Lower Extremity: full  Hip precautions: none Suture Removal: 2 weeks   N. Eduard Roux, MD [name] 2:05 PM   Implant Name Type Inv. Item Serial No. Manufacturer Lot No. LRB No. Used Action  PIN SECTOR W/GRIP ACE CUP 52MM - [id number] Hips PIN SECTOR W/GRIP ACE CUP 52MM  [employer] [id number] Left 1 Implanted  LINER NEUTRAL 52X36MM PLUS 4 - [id number] Liner LINER NEUTRAL 52X36MM PLUS 4  [employer] [id number] Left 1 Implanted  SCREW 6.5MMX25MM - [id number] Screw SCREW 6.5MMX25MM  [employer] [id number] Left 1 Implanted  STEM FEM ACTIS STD SZ4 - [id number] Stem STEM FEM ACTIS STD SZ4  [employer] [id number] Left 1 Implanted  HEAD CERAMIC DELTA 36 PLUS 1.5 - [id number] Hips HEAD CERAMIC DELTA 36 PLUS 1.5  [employer] [id number] Left 1 Implanted

## 2022-11-01 NOTE — Plan of Care (Signed)
  Problem: Clinical Measurements: Goal: Postoperative complications will be avoided or minimized Outcome: Progressing   Problem: Pain Management: Goal: Pain level will decrease with appropriate interventions Outcome: Progressing   Problem: Coping: Goal: Level of anxiety will decrease Outcome: Progressing   Problem: Pain Managment: Goal: General experience of comfort will improve Outcome: Progressing

## 2022-11-01 NOTE — Evaluation (Signed)
Physical Therapy Evaluation Patient Details Name: [name] MRN: [id number] DOB: 1951-05-31 Today's Date: 11/01/2022  History of Present Illness  72 y.o. female presents to [facility] hospital on 11/01/2022 for elective L THA. PMH includes breast cancer s/p mastectomy, carpal tunnel syndrome.  Clinical Impression  Pt presents to PT with deficits in strength, power, endurance, gait, balance, functional mobility. Pt is able to transfer and ambulate with support of RW at this time. Pt demonstrates good stability with support of walker in standing. PT provides education on THA exercise program as well as the need for frequent mobilization. PT anticipates the pt will progress very well.       Recommendations for follow up therapy are one component of a multi-disciplinary discharge planning process, led by the attending physician.  Recommendations may be updated based on patient status, additional functional criteria and insurance authorization.  Follow Up Recommendations Follow physician's recommendations for discharge plan and follow up therapies      Assistance Recommended at Discharge PRN  Patient can return home with the following  A little help with bathing/dressing/bathroom;Assistance with cooking/housework;Assist for transportation;Help with stairs or ramp for entrance    Equipment Recommendations Rolling walker (2 wheels)  Recommendations for Other Services       Functional Status Assessment Patient has had a recent decline in their functional status and demonstrates the ability to make significant improvements in function in a reasonable and predictable amount of time.     Precautions / Restrictions Precautions Precautions: Fall Precaution Comments: direct anterior Restrictions Weight Bearing Restrictions: No      Mobility  Bed Mobility Overal bed mobility: Modified Independent             General bed mobility comments: increased time, use of UE to assist LLE     Transfers Overall transfer level: Needs assistance Equipment used: Rolling walker (2 wheels) Transfers: Sit to/from Stand Sit to Stand: Min guard                Ambulation/Gait Ambulation/Gait assistance: Min guard Gait Distance (Feet): 150 Feet Assistive device: Rolling walker (2 wheels) Gait Pattern/deviations: Step-through pattern Gait velocity: reduced Gait velocity interpretation: <1.8 ft/sec, indicate of risk for recurrent falls   General Gait Details: slowed step-through gait  Stairs            Wheelchair Mobility    Modified Rankin (Stroke Patients Only)       Balance Overall balance assessment: Needs assistance Sitting-balance support: No upper extremity supported, Feet supported Sitting balance-Leahy Scale: Good     Standing balance support: Bilateral upper extremity supported, Reliant on assistive device for balance Standing balance-Leahy Scale: Poor                               Pertinent Vitals/Pain Pain Assessment Pain Assessment: 0-10 Pain Score: 3  Pain Location: L hip Pain Descriptors / Indicators: Sore Pain Intervention(s): Monitored during session    Home Living Family/patient expects to be discharged to:: Private residence Living Arrangements: Spouse/significant other Available Help at Discharge: Family;Available 24 hours/day Type of Home: House Home Access: Stairs to enter Entrance Stairs-Rails: Right Entrance Stairs-Number of Steps: 6 Alternate Level Stairs-Number of Steps: 16 (to access basement with pt's computer and TV) Home Layout: Multi-level;Able to live on main level with bedroom/bathroom Home Equipment: Kasandra Knudsen - single point;Toilet riser      Prior Function Prior Level of Function : Independent/Modified Independent;Driving  Mobility Comments: enjoys traveling, recently went to [place]       Hand Dominance   Dominant Hand: Right    Extremity/Trunk Assessment   Upper Extremity  Assessment Upper Extremity Assessment: Overall WFL for tasks assessed    Lower Extremity Assessment Lower Extremity Assessment: LLE deficits/detail LLE Deficits / Details: generalized weakness, not formally assessed    Cervical / Trunk Assessment Cervical / Trunk Assessment: Normal  Communication   Communication: No difficulties  Cognition Arousal/Alertness: Awake/alert Behavior During Therapy: WFL for tasks assessed/performed Overall Cognitive Status: Within Functional Limits for tasks assessed                                          General Comments General comments (skin integrity, edema, etc.): VSS on RA    Exercises     Assessment/Plan    PT Assessment Patient needs continued PT services  PT Problem List Decreased strength;Decreased activity tolerance;Decreased balance;Decreased mobility;Decreased knowledge of use of DME;Pain       PT Treatment Interventions DME instruction;Gait training;Stair training;Functional mobility training;Therapeutic activities;Therapeutic exercise;Balance training;Neuromuscular re-education;Patient/family education    PT Goals (Current goals can be found in the Care Plan section)  Acute Rehab PT Goals Patient Stated Goal: to return to independence PT Goal Formulation: With patient Time For Goal Achievement: 11/06/22 Potential to Achieve Goals: Good    Frequency 7X/week     Co-evaluation               AM-PAC PT "6 Clicks" Mobility  Outcome Measure Help needed turning from your back to your side while in a flat bed without using bedrails?: None Help needed moving from lying on your back to sitting on the side of a flat bed without using bedrails?: None Help needed moving to and from a bed to a chair (including a wheelchair)?: A Little Help needed standing up from a chair using your arms (e.g., wheelchair or bedside chair)?: A Little Help needed to walk in hospital room?: A Little Help needed climbing 3-5 steps  with a railing? : A Lot 6 Click Score: 19    End of Session   Activity Tolerance: Patient tolerated treatment well Patient left: in chair;with call bell/phone within reach;with family/visitor present Nurse Communication: Mobility status PT Visit Diagnosis: Other abnormalities of gait and mobility (R26.89);Muscle weakness (generalized) (M62.81);Pain Pain - Right/Left: Left Pain - part of body: Hip    Time: [id number] PT Time Calculation (min) (ACUTE ONLY): 33 min   Charges:   PT Evaluation $PT Eval Low Complexity: 1 Low          Zenaida Niece, PT, DPT Acute Rehabilitation Office [phone]   Zenaida Niece 11/01/2022, 5:33 PM

## 2022-11-01 NOTE — H&P (Signed)
PREOPERATIVE H&P  Chief Complaint: left hip osteoarthritis  HPI: [name] is a 72 y.o. female who presents for surgical treatment of left hip osteoarthritis.  She denies any changes in medical history.  Past Medical History:  Diagnosis Date   Arthritis    "right hip; hands" (07/01/2017)   Cancer of left breast ([place]) 08/2003   S/p left breast mastectomy   Carpal tunnel syndrome, bilateral    PONV (postoperative nausea and vomiting)    Past Surgical History:  Procedure Laterality Date   BREAST BIOPSY Left 2005   CARPAL TUNNEL RELEASE Bilateral    COLONOSCOPY     FRACTURE SURGERY  07/01/2017   MASTECTOMY Left 10/14/2003   MASTECTOMY WITH AXILLARY LYMPH NODE DISSECTION Left 10/14/2003   OPEN REDUCTION INTERNAL FIXATION (ORIF) TIBIA/FIBULA FRACTURE Left 07/01/2017   OPEN REDUCTION INTERNAL FIXATION (ORIF) LEFT TIBIA/PILON FRACTURE/FIBULA FRACTURE    ORIF ANKLE FRACTURE Left 07/01/2017   Procedure: OPEN REDUCTION INTERNAL FIXATION (ORIF) LEFT TIBIA/PILON FRACTURE/FIBULA FRACTURE;  Surgeon: Leandrew Koyanagi, MD;  Location: [facility];  Service: Orthopedics;  Laterality: Left;   RECONSTRUCTION BREAST W/ TRAM FLAP Left 10/14/2003   Social History   Socioeconomic History   Marital status: Married    Spouse name: Elenore Rota   Number of children: 2   Years of education: Not on file   Highest education level: Not on file  Occupational History   Not on file  Tobacco Use   Smoking status: Never   Smokeless tobacco: Never  Vaping Use   Vaping Use: Never used  Substance and Sexual Activity   Alcohol use: Yes    Alcohol/week: 6.0 standard drinks of alcohol    Types: 6 Glasses of wine per week   Drug use: No   Sexual activity: Yes    Birth control/protection: Post-menopausal  Other Topics Concern   Not on file  Social History Narrative   Not on file   Social Determinants of Health   Financial Resource Strain: Not on file  Food Insecurity: Not on file  Transportation Needs: Not  on file  Physical Activity: Not on file  Stress: Not on file  Social Connections: Not on file   Family History  Problem Relation Age of Onset   Alzheimer's disease Mother    Seizures Mother    No Known Allergies Prior to Admission medications   Medication Sig Start Date End Date Taking? Authorizing Provider  Calcium Carb-Cholecalciferol (CALCIUM 600+D) 600-10 MG-MCG TABS Take 1 tablet by mouth in the morning and at bedtime.   Yes [provider]  Cholecalciferol (VITAMIN D) 2000 UNITS tablet Take 2,000 Units by mouth daily.   Yes [provider]  Glucosamine HCl-MSM (GLUCOSAMINE-MSM PO) Take 1,500 mg by mouth in the morning and at bedtime.   Yes [provider]  guaiFENesin (MUCINEX) 600 MG 12 hr tablet Take 600 mg by mouth 2 (two) times daily.   Yes [provider]  Javier Docker Oil 500 MG CAPS Take 500 mg by mouth daily.    Yes [provider]  loratadine (CLARITIN) 10 MG tablet Take 10 mg by mouth daily.   Yes [provider]  Multiple Vitamins-Minerals (MULTIVITAMIN WITH MINERALS) tablet Take 1 tablet by mouth daily.   Yes [provider]  rosuvastatin (CRESTOR) 10 MG tablet Take 10 mg by mouth at bedtime.   Yes [provider]  Grant Ruts INHUB 250-50 MCG/ACT AEPB Inhale 1 puff into the lungs 2 (two) times daily. 10/11/22  Yes  [provider]  docusate sodium (COLACE) 100 MG capsule Take 1 capsule (100 mg total) by mouth daily as needed. 10/24/22 10/24/23  Aundra Dubin, PA-C  methocarbamol (ROBAXIN-750) 750 MG tablet Take 1 tablet (750 mg total) by mouth 2 (two) times daily as needed for muscle spasms. 10/24/22   Aundra Dubin, PA-C  ondansetron (ZOFRAN) 4 MG tablet Take 1 tablet (4 mg total) by mouth every 8 (eight) hours as needed for nausea or vomiting. 10/24/22   Aundra Dubin, PA-C  oxyCODONE-acetaminophen (PERCOCET) 5-325 MG tablet Take 1-2 tablets by mouth every 8 (eight) hours as needed. To be taken after  surgery 10/24/22   Aundra Dubin, PA-C  rivaroxaban (XARELTO) 10 MG TABS tablet Take 1 tablet (10 mg total) by mouth daily. To be taken after surgery to prevent blood clots 10/24/22   Aundra Dubin, PA-C  traMADol (ULTRAM) 50 MG tablet Take 1-2 tablets (50-100 mg total) by mouth daily as needed. Patient not taking: Reported on 10/25/2022 07/30/22   Leandrew Koyanagi, MD     Positive ROS: All other systems have been reviewed and were otherwise negative with the exception of those mentioned in the HPI and as above.  Physical Exam: General: Alert, no acute distress Cardiovascular: No pedal edema Respiratory: No cyanosis, no use of accessory musculature GI: abdomen soft Skin: No lesions in the area of chief complaint Neurologic: Sensation intact distally Psychiatric: Patient is competent for consent with normal mood and affect Lymphatic: no lymphedema  MUSCULOSKELETAL: exam stable  Assessment: left hip osteoarthritis  Plan: Plan for Procedure(s): LEFT TOTAL HIP ARTHROPLASTY ANTERIOR APPROACH  The risks benefits and alternatives were discussed with the patient including but not limited to the risks of nonoperative treatment, versus surgical intervention including infection, bleeding, nerve injury,  blood clots, cardiopulmonary complications, morbidity, mortality, among others, and they were willing to proceed.   Eduard Roux, MD 11/01/2022 10:32 AM

## 2022-11-01 NOTE — Anesthesia Procedure Notes (Signed)
Procedure Name: MAC Date/Time: 11/01/2022 12:10 PM  Performed by: Inda Coke, CRNAPre-anesthesia Checklist: Patient identified, Emergency Drugs available, Suction available, Timeout performed and Patient being monitored Patient Re-evaluated:Patient Re-evaluated prior to induction Oxygen Delivery Method: Simple face mask Induction Type: IV induction Dental Injury: Teeth and Oropharynx as per pre-operative assessment

## 2022-11-02 ENCOUNTER — Encounter (HOSPITAL_COMMUNITY): Payer: Self-pay | Admitting: Orthopaedic Surgery

## 2022-11-02 DIAGNOSIS — M1612 Unilateral primary osteoarthritis, left hip: Secondary | ICD-10-CM | POA: Diagnosis not present

## 2022-11-02 LAB — CBC
HCT: 30.3 % — ABNORMAL LOW (ref 36.0–46.0)
Hemoglobin: 9.8 g/dL — ABNORMAL LOW (ref 12.0–15.0)
MCH: 31.6 pg (ref 26.0–34.0)
MCHC: 32.3 g/dL (ref 30.0–36.0)
MCV: 97.7 fL (ref 80.0–100.0)
Platelets: 243 10*3/uL (ref 150–400)
RBC: 3.1 MIL/uL — ABNORMAL LOW (ref 3.87–5.11)
RDW: 12.2 % (ref 11.5–15.5)
WBC: 11.9 10*3/uL — ABNORMAL HIGH (ref 4.0–10.5)
nRBC: 0 % (ref 0.0–0.2)

## 2022-11-02 MED ORDER — BUPIVACAINE IN DEXTROSE 0.75-8.25 % IT SOLN
INTRATHECAL | Status: DC | PRN
  Administered 2022-11-01: 1.8 mL via INTRATHECAL

## 2022-11-02 NOTE — Progress Notes (Signed)
   Subjective:  Patient reports pain as moderate.  Did well with 1st PT session  Objective:   VITALS:   Vitals:   11/01/22 1557 11/01/22 2004 11/02/22 0508 11/02/22 0600  BP: (!) 142/64 109/76 (!) 121/45 (!) 133/47  Pulse: 74 73 91 85  Resp: '16  18 18  '$ Temp: 98 F (36.7 C) 98.7 F (37.1 C) 99.6 F (37.6 C) 100.2 F (37.9 C)  TempSrc: Oral Oral Oral Oral  SpO2: 97% 97% (!) 89% 94%  Weight:      Height:        Neurovascular intact Sensation intact distally Intact pulses distally Dorsiflexion/Plantar flexion intact Incision: dressing C/D/I and no drainage Compartment soft Leg lengths equal   Lab Results  Component Value Date   WBC 11.9 (H) 11/02/2022   HGB 9.8 (L) 11/02/2022   HCT 30.3 (L) 11/02/2022   MCV 97.7 11/02/2022   PLT 243 11/02/2022     Assessment/Plan:  1 Day Post-Op   - Expected postop acute blood loss anemia - Up with PT/OT - DVT ppx - SCDs, ambulation, aspirin - WBAT operative extremity - Pain control - Discharge planning - would expect patient to progress well with PT and d/c home today  Eduard Roux 11/02/2022, 7:33 AM

## 2022-11-02 NOTE — Anesthesia Postprocedure Evaluation (Signed)
Anesthesia Post Note  Patient: [name]  Procedure(s) Performed: LEFT TOTAL HIP ARTHROPLASTY ANTERIOR APPROACH (Left: Hip)     Patient location during evaluation: PACU Anesthesia Type: MAC and Spinal Level of consciousness: awake and alert Pain management: pain level controlled Vital Signs Assessment: post-procedure vital signs reviewed and stable Respiratory status: spontaneous breathing, nonlabored ventilation and respiratory function stable Cardiovascular status: stable and blood pressure returned to baseline Postop Assessment: no apparent nausea or vomiting Anesthetic complications: no   No notable events documented.  Last Vitals:  Vitals:   11/02/22 0508 11/02/22 0600  BP: (!) 121/45 (!) 133/47  Pulse: 91 85  Resp: 18 18  Temp: 37.6 C 37.9 C  SpO2: (!) 89% 94%    Last Pain:  Vitals:   11/02/22 0600  TempSrc: Oral  PainSc: 0-No pain                 Corneshia Hines

## 2022-11-02 NOTE — Care Plan (Signed)
Ortho Bundle Case Management Note  Patient Details  Name: [name] MRN: [id number] Date of Birth: [birth year]   [facility] RNCM call to patient to discuss her upcoming Left total hip arthroplasty with Dr. Erlinda Hong on 11/01/22. She is an Ortho bundle through [facility] and is agreeable to case management. She lives with her spouse, who will be assisting at discharge. She has a standard walker and another 4 wheeled walker that she has borrowed from church, but unsure if those would be acceptable. Will discuss in hospital. She has a 3in1/BSC already as well. Anticipate HHPT will be needed after a short hospital stay. Referral made to [facility] and gave verbal acceptance by office staff. Reviewed all post op care instructions. Will continue to follow for needs.                  DME Arranged:   (May need RW- was going to see if she could borrow from church) DME Agency:     HH Arranged:  PT [place]:  [place]  Additional Comments: Please contact me with any questions of if this plan should need to change.  Jamse Arn, RN, BSN, [employer]  [phone] 11/02/2022, 9:12 AM

## 2022-11-02 NOTE — Anesthesia Procedure Notes (Signed)
Spinal  Patient location during procedure: OR Start time: 11/01/2022 12:16 PM End time: 11/01/2022 12:22 PM Reason for block: surgical anesthesia Staffing Performed by: Oleta Mouse, MD Authorized by: Oleta Mouse, MD   Preanesthetic Checklist Completed: patient identified, IV checked, site marked, risks and benefits discussed, surgical consent, monitors and equipment checked, pre-op evaluation and timeout performed Spinal Block Patient position: sitting Prep: DuraPrep Patient monitoring: heart rate, cardiac monitor, continuous pulse ox and blood pressure Approach: midline Location: L3-4 Injection technique: single-shot Needle Needle type: Sprotte  Needle gauge: 24 G Needle length: 9 cm Assessment Sensory level: T6 Events: CSF return

## 2022-11-02 NOTE — Progress Notes (Signed)
Physical Therapy Treatment Patient Details Name: [name] MRN: [id number] DOB: [birth year] Today's Date: 11/02/2022   History of Present Illness 72 y.o. female presents to [facility] hospital on 11/01/2022 for elective L THA. PMH includes breast cancer s/p mastectomy, carpal tunnel syndrome.    PT Comments    Pt was received in supine and agreeable to session. Pt was able to increase gait distance with supervision for safety, however demonstrated even stride length and good cadence without instability with RW. Pt demonstrated mild instability during short gait distance without RW, however was able to stand at sink to brush teeth without UE support without LOB. Pt demonstrated safe stair technique with cues for sequence and min guard for safety. Provided copy of HEP and reviewed THA exercises with pt. Anticipate pt and family will be able to manage pt mobility needs at home.   Recommendations for follow up therapy are one component of a multi-disciplinary discharge planning process, led by the attending physician.  Recommendations may be updated based on patient status, additional functional criteria and insurance authorization.  Follow Up Recommendations  Follow physician's recommendations for discharge plan and follow up therapies     Assistance Recommended at Discharge PRN  Patient can return home with the following A little help with bathing/dressing/bathroom;Assistance with cooking/housework;Assist for transportation;Help with stairs or ramp for entrance   Equipment Recommendations  Rolling walker (2 wheels)    Recommendations for Other Services       Precautions / Restrictions Precautions Precautions: Fall Precaution Comments: direct anterior Restrictions Weight Bearing Restrictions: No     Mobility  Bed Mobility Overal bed mobility: Modified Independent             General bed mobility comments: increased time and HOB  elevated    Transfers Overall transfer level: Needs  assistance Equipment used: Rolling walker (2 wheels) Transfers: Sit to/from Stand Sit to Stand: Supervision           General transfer comment: from EOB, raised toilet, and recliner. Cues for LLE placement to reduce pain    Ambulation/Gait Ambulation/Gait assistance: Supervision Gait Distance (Feet): 225 Feet Assistive device: Rolling walker (2 wheels) Gait Pattern/deviations: Step-through pattern       General Gait Details: Pt demonstrating good cadence and WB through LLE. Supervision for safety.   Stairs Stairs: Yes Stairs assistance: Min guard Stair Management: One rail Right Number of Stairs: 10 General stair comments: Pt able to demonstrate safe stair technique with min guard for safety and cues for sequence. Pt performed forward and sideways.       Balance Overall balance assessment: Needs assistance Sitting-balance support: No upper extremity supported, Feet supported Sitting balance-Leahy Scale: Good Sitting balance - Comments: sitting EOB   Standing balance support: Bilateral upper extremity supported, Reliant on assistive device for balance Standing balance-Leahy Scale: Fair Standing balance comment: with RW support                            Cognition Arousal/Alertness: Awake/alert Behavior During Therapy: WFL for tasks assessed/performed Overall Cognitive Status: Within Functional Limits for tasks assessed                                          Exercises Other Exercises Other Exercises: Reviewed THA HEP    General Comments        Pertinent Vitals/Pain  Pain Assessment Pain Assessment: 0-10 Pain Score: 2  Pain Location: L hip Pain Descriptors / Indicators: Sore Pain Intervention(s): Limited activity within patient's tolerance, Monitored during session, Ice applied, Repositioned     PT Goals (current goals can now be found in the care plan section) Acute Rehab PT Goals Patient Stated Goal: to return to  independence PT Goal Formulation: With patient Time For Goal Achievement: 11/06/22 Potential to Achieve Goals: Good Progress towards PT goals: Progressing toward goals    Frequency    7X/week      PT Plan Current plan remains appropriate       AM-PAC PT "6 Clicks" Mobility   Outcome Measure  Help needed turning from your back to your side while in a flat bed without using bedrails?: None Help needed moving from lying on your back to sitting on the side of a flat bed without using bedrails?: None Help needed moving to and from a bed to a chair (including a wheelchair)?: A Little Help needed standing up from a chair using your arms (e.g., wheelchair or bedside chair)?: A Little Help needed to walk in hospital room?: A Little Help needed climbing 3-5 steps with a railing? : A Little 6 Click Score: 20    End of Session Equipment Utilized During Treatment: Gait belt Activity Tolerance: Patient tolerated treatment well Patient left: in chair;with call bell/phone within reach;with family/visitor present Nurse Communication: Mobility status PT Visit Diagnosis: Other abnormalities of gait and mobility (R26.89);Muscle weakness (generalized) (M62.81);Pain     Time: [id number] PT Time Calculation (min) (ACUTE ONLY): 31 min  Charges:  $Gait Training: 23-37 mins                     Michelle Nasuti, PTA Acute Rehabilitation Services Secure Chat Preferred  [phone]    Michelle Nasuti 11/02/2022, 10:30 AM

## 2022-11-02 NOTE — Progress Notes (Signed)
Discharge instructions given. Patient verbalized understanding and all questions were answered.  

## 2022-11-02 NOTE — Care Management (Signed)
Bundled. HH and DME arranged through office. No additional TOC needs identified for DC

## 2022-11-02 NOTE — Discharge Summary (Signed)
Patient ID: [name] MRN: [id number] DOB/AGE: [birth year] 72 y.o.  Admit date: 11/01/2022 Discharge date: 11/02/2022  Admission Diagnoses:  Primary osteoarthritis of left hip  Discharge Diagnoses:  Principal Problem:   Primary osteoarthritis of left hip Active Problems:   Status post total replacement of left hip   Past Medical History:  Diagnosis Date   Arthritis    "right hip; hands" (07/01/2017)   Cancer of left breast ([place]) 08/2003   S/p left breast mastectomy   Carpal tunnel syndrome, bilateral    PONV (postoperative nausea and vomiting)     Surgeries: Procedure(s): LEFT TOTAL HIP ARTHROPLASTY ANTERIOR APPROACH on 11/01/2022   Consultants (if any):   Discharged Condition: Improved  Hospital Course: [name] is an 72 y.o. female who was admitted 11/01/2022 with a diagnosis of Primary osteoarthritis of left hip and went to the operating room on 11/01/2022 and underwent the above named procedures.    She was given perioperative antibiotics:  Anti-infectives (From admission, onward)    Start     Dose/Rate Route Frequency Ordered Stop   11/01/22 1645  ceFAZolin (ANCEF) IVPB 2g/100 mL premix        2 g 200 mL/hr over 30 Minutes Intravenous Every 6 hours 11/01/22 1550 11/01/22 2225   11/01/22 1307  vancomycin (VANCOCIN) powder  Status:  Discontinued          As needed 11/01/22 1308 11/01/22 1441   11/01/22 1015  ceFAZolin (ANCEF) IVPB 2g/100 mL premix        2 g 200 mL/hr over 30 Minutes Intravenous On call to O.R. 11/01/22 1004 11/01/22 1224     .  She was given sequential compression devices, early ambulation, and appropriate chemoprophylaxis for DVT prophylaxis.  She benefited maximally from the hospital stay and there were no complications.    Recent vital signs:  Vitals:   11/02/22 0508 11/02/22 0600  BP: (!) 121/45 (!) 133/47  Pulse: 91 85  Resp: 18 18  Temp: 99.6 F (37.6 C) 100.2 F (37.9 C)  SpO2: (!) 89% 94%    Recent laboratory studies:   Lab Results  Component Value Date   HGB 9.8 (L) 11/02/2022   HGB 12.9 10/26/2022   HGB 12.5 07/01/2017   Lab Results  Component Value Date   WBC 11.9 (H) 11/02/2022   PLT 243 11/02/2022   No results found for: "INR" Lab Results  Component Value Date   NA 139 10/26/2022   K 4.4 10/26/2022   CL 101 10/26/2022   CO2 28 10/26/2022   BUN 9 10/26/2022   CREATININE 0.73 10/26/2022   GLUCOSE 108 (H) 10/26/2022    Discharge Medications:   Allergies as of 11/02/2022   No Known Allergies      Medication List     STOP taking these medications    GLUCOSAMINE-MSM PO   Krill Oil 500 MG Caps   traMADol 50 MG tablet Commonly known as: ULTRAM       TAKE these medications    Calcium 600+D 600-10 MG-MCG Tabs Generic drug: Calcium Carb-Cholecalciferol Take 1 tablet by mouth in the morning and at bedtime.   docusate sodium 100 MG capsule Commonly known as: Colace Take 1 capsule (100 mg total) by mouth daily as needed.   guaiFENesin 600 MG 12 hr tablet Commonly known as: MUCINEX Take 600 mg by mouth 2 (two) times daily.   loratadine 10 MG tablet Commonly known as: CLARITIN Take 10 mg by mouth daily.  methocarbamol 750 MG tablet Commonly known as: Robaxin-750 Take 1 tablet (750 mg total) by mouth 2 (two) times daily as needed for muscle spasms.   multivitamin with minerals tablet Take 1 tablet by mouth daily.   ondansetron 4 MG tablet Commonly known as: Zofran Take 1 tablet (4 mg total) by mouth every 8 (eight) hours as needed for nausea or vomiting.   oxyCODONE-acetaminophen 5-325 MG tablet Commonly known as: Percocet Take 1-2 tablets by mouth every 8 (eight) hours as needed. To be taken after surgery   rivaroxaban 10 MG Tabs tablet Commonly known as: XARELTO Take 1 tablet (10 mg total) by mouth daily. To be taken after surgery to prevent blood clots   rosuvastatin 10 MG tablet Commonly known as: CRESTOR Take 10 mg by mouth at bedtime.   Vitamin D 50 MCG  (2000 UT) tablet Take 2,000 Units by mouth daily.   Wixela Inhub 250-50 MCG/ACT Aepb Generic drug: fluticasone-salmeterol Inhale 1 puff into the lungs 2 (two) times daily.               Durable Medical Equipment  (From admission, onward)           Start     Ordered   11/01/22 1550  DME Walker rolling  Once       Question:  Patient needs a walker to treat with the following condition  Answer:  History of hip replacement   11/01/22 1550   11/01/22 1550  DME 3 n 1  Once        11/01/22 1550   11/01/22 1550  DME Bedside commode  Once       Question:  Patient needs a bedside commode to treat with the following condition  Answer:  History of hip replacement   11/01/22 1550            Diagnostic Studies: DG Pelvis Portable  Result Date: 11/01/2022 CLINICAL DATA:  [id number] Hip joint replacement status [id number] EXAM: PORTABLE PELVIS 1-2 VIEWS COMPARISON:  07/06/2022 FINDINGS: Postsurgical changes of left total hip arthroplasty. Normal alignment. Expected soft tissue changes. No evidence of immediate complication. IMPRESSION: Postsurgical changes of left total hip arthroplasty. Normal alignment. No evidence of immediate complication. Electronically Signed   By: Maurine Simmering M.D.   On: 11/01/2022 15:44   DG HIP UNILAT WITH PELVIS 1V LEFT  Result Date: 11/01/2022 CLINICAL DATA:  Fluoroscopic assistance for left hip arthroplasty EXAM: DG HIP (WITH OR WITHOUT PELVIS) 1V*L* COMPARISON:  07/06/2022 FINDINGS: Fluoroscopic images show left hip arthroplasty. 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 Fluoroscopic time 30 seconds. Radiation dose 3.75 mGy. IMPRESSION: Fluoroscopic assistance was provided for left hip arthroplasty. Electronically Signed   By: Elmer Picker M.D.   On: 11/01/2022 14:54   DG C-Arm 1-60 Min-No Report  Result Date: 11/01/2022 Fluoroscopy was utilized by the requesting physician.  No radiographic interpretation.   DG C-Arm 1-60  Min-No Report  Result Date: 11/01/2022 Fluoroscopy was utilized by the requesting physician.  No radiographic interpretation.    Disposition: Discharge disposition: 01-Home or Self Care       Discharge Instructions     Call MD / Call 911   Complete by: As directed    If you experience chest pain or shortness of breath, CALL 911 and be transported to the hospital emergency room.  If you develope a fever above 101.5 F, pus (white drainage) or increased drainage or redness at the wound,  or calf pain, call your surgeon's office.   Constipation Prevention   Complete by: As directed    Drink plenty of fluids.  Prune juice may be helpful.  You may use a stool softener, such as Colace (over the counter) 100 mg twice a day.  Use MiraLax (over the counter) for constipation as needed.   Driving restrictions   Complete by: As directed    No driving while taking narcotic pain meds.   Increase activity slowly as tolerated   Complete by: As directed    Post-operative opioid taper instructions:   Complete by: As directed    POST-OPERATIVE OPIOID TAPER INSTRUCTIONS: It is important to wean off of your opioid medication as soon as possible. If you do not need pain medication after your surgery it is ok to stop day one. Opioids include: Codeine, Hydrocodone(Norco, Vicodin), Oxycodone(Percocet, oxycontin) and hydromorphone amongst others.  Long term and even short term use of opiods can cause: Increased pain response Dependence Constipation Depression Respiratory depression And more.  Withdrawal symptoms can include Flu like symptoms Nausea, vomiting And more Techniques to manage these symptoms Hydrate well Eat regular healthy meals Stay active Use relaxation techniques(deep breathing, meditating, yoga) Do Not substitute Alcohol to help with tapering If you have been on opioids for less than two weeks and do not have pain than it is ok to stop all together.  Plan to wean off of  opioids This plan should start within one week post op of your joint replacement. Maintain the same interval or time between taking each dose and first decrease the dose.  Cut the total daily intake of opioids by one tablet each day Next start to increase the time between doses. The last dose that should be eliminated is the evening dose.           Follow-up Information     Leandrew Koyanagi, MD. Schedule an appointment as soon as possible for a visit in 2 week(s).   Specialty: Orthopedic Surgery Contact information: [address] [place] [address] [phone]                  Signed: Eduard Roux 11/02/2022, 7:34 AM

## 2022-11-02 NOTE — Plan of Care (Signed)
  Problem: Education: Goal: Knowledge of the prescribed therapeutic regimen will improve Outcome: Adequate for Discharge Goal: Understanding of discharge needs will improve Outcome: Adequate for Discharge Goal: Individualized Educational Video(s) Outcome: Adequate for Discharge   Problem: Activity: Goal: Ability to avoid complications of mobility impairment will improve Outcome: Adequate for Discharge Goal: Ability to tolerate increased activity will improve Outcome: Adequate for Discharge   Problem: Clinical Measurements: Goal: Postoperative complications will be avoided or minimized Outcome: Adequate for Discharge   Problem: Pain Management: Goal: Pain level will decrease with appropriate interventions Outcome: Adequate for Discharge   Problem: Skin Integrity: Goal: Will show signs of wound healing Outcome: Adequate for Discharge   Problem: Education: Goal: Knowledge of General Education information will improve Description: Including pain rating scale, medication(s)/side effects and non-pharmacologic comfort measures Outcome: Adequate for Discharge   Problem: Health Behavior/Discharge Planning: Goal: Ability to manage health-related needs will improve Outcome: Adequate for Discharge   Problem: Clinical Measurements: Goal: Ability to maintain clinical measurements within normal limits will improve Outcome: Adequate for Discharge Goal: Will remain free from infection Outcome: Adequate for Discharge Goal: Diagnostic test results will improve Outcome: Adequate for Discharge Goal: Respiratory complications will improve Outcome: Adequate for Discharge Goal: Cardiovascular complication will be avoided Outcome: Adequate for Discharge   Problem: Activity: Goal: Risk for activity intolerance will decrease Outcome: Adequate for Discharge   Problem: Nutrition: Goal: Adequate nutrition will be maintained Outcome: Adequate for Discharge   Problem: Coping: Goal: Level of  anxiety will decrease Outcome: Adequate for Discharge   Problem: Elimination: Goal: Will not experience complications related to bowel motility Outcome: Adequate for Discharge Goal: Will not experience complications related to urinary retention Outcome: Adequate for Discharge   Problem: Pain Managment: Goal: General experience of comfort will improve Outcome: Adequate for Discharge   Problem: Safety: Goal: Ability to remain free from injury will improve Outcome: Adequate for Discharge   Problem: Skin Integrity: Goal: Risk for impaired skin integrity will decrease Outcome: Adequate for Discharge   Problem: Acute Rehab PT Goals(only PT should resolve) Goal: Pt Will Go Supine/Side To Sit Outcome: Adequate for Discharge Goal: Pt Will Go Sit To Supine/Side Outcome: Adequate for Discharge Goal: Patient Will Transfer Sit To/From Stand Outcome: Adequate for Discharge Goal: Pt Will Transfer Bed To Chair/Chair To Bed Outcome: Adequate for Discharge Goal: Pt Will Ambulate Outcome: Adequate for Discharge Goal: Pt Will Go Up/Down Stairs Outcome: Adequate for Discharge Goal: Pt/caregiver will Perform Home Exercise Program Outcome: Adequate for Discharge

## 2022-11-03 ENCOUNTER — Other Ambulatory Visit: Payer: Self-pay

## 2022-11-03 DIAGNOSIS — Z7901 Long term (current) use of anticoagulants: Secondary | ICD-10-CM | POA: Diagnosis not present

## 2022-11-03 DIAGNOSIS — Z853 Personal history of malignant neoplasm of breast: Secondary | ICD-10-CM | POA: Diagnosis not present

## 2022-11-03 DIAGNOSIS — Z471 Aftercare following joint replacement surgery: Secondary | ICD-10-CM | POA: Diagnosis not present

## 2022-11-03 DIAGNOSIS — Z96642 Presence of left artificial hip joint: Secondary | ICD-10-CM | POA: Diagnosis not present

## 2022-11-03 DIAGNOSIS — M1389 Other specified arthritis, multiple sites: Secondary | ICD-10-CM | POA: Diagnosis not present

## 2022-11-04 ENCOUNTER — Telehealth: Payer: Self-pay | Admitting: *Deleted

## 2022-11-04 NOTE — Telephone Encounter (Signed)
Ortho bundle D/C call completed; patient doing extremely well.

## 2022-11-09 ENCOUNTER — Other Ambulatory Visit (HOSPITAL_COMMUNITY): Payer: Self-pay

## 2022-11-10 ENCOUNTER — Other Ambulatory Visit (HOSPITAL_COMMUNITY): Payer: Self-pay

## 2022-11-11 ENCOUNTER — Telehealth: Payer: Self-pay | Admitting: *Deleted

## 2022-11-11 NOTE — Telephone Encounter (Signed)
Ortho bundle 7 day call completed. 

## 2022-11-15 ENCOUNTER — Other Ambulatory Visit (HOSPITAL_COMMUNITY): Payer: Self-pay

## 2022-11-16 ENCOUNTER — Other Ambulatory Visit: Payer: Self-pay | Admitting: Physician Assistant

## 2022-11-16 ENCOUNTER — Ambulatory Visit (INDEPENDENT_AMBULATORY_CARE_PROVIDER_SITE_OTHER): Payer: PPO | Admitting: Physician Assistant

## 2022-11-16 DIAGNOSIS — Z96642 Presence of left artificial hip joint: Secondary | ICD-10-CM

## 2022-11-16 NOTE — Progress Notes (Signed)
Post-Op Visit Note   Patient: [name]           Date of Birth: November 05, 1950           MRN: [id number] Visit Date: 11/16/2022 PCP: Shon Baton, MD   Assessment & Plan:  Chief Complaint:  Chief Complaint  Patient presents with   Right Hip - Routine Post Op   Visit Diagnoses:  1. Status post total replacement of left hip     Plan: Patient is a pleasant 72 year old female who comes in today 2 weeks status post left total hip replacement 11/01/2022.  She has been doing well.  She has finished home health physical therapy and is ambulating unassisted.  She is taking Tylenol and Robaxin during the day and oxycodone at night.  She has been compliant taking Xarelto for DVT prophylaxis.  Examination of her left hip reveals a fully healed surgical scar with nylon sutures in place.  No evidence of infection or cellulitis.  Calves are soft and nontender.  She is neurovascularly intact distally.  Today, sutures were removed and Steri-Strips applied.  Dental prophylaxis reinforced.  Continue with the Xarelto for another 2 weeks.  Follow-up with [place] in 4 weeks for repeat evaluation and AP pelvis x-rays.  Call with concerns or questions.  Follow-Up Instructions: Return in about 4 weeks (around 12/14/2022).   Orders:  No orders of the defined types were placed in this encounter.  No orders of the defined types were placed in this encounter.   Imaging: No new imaging  PMFS History: Patient Active Problem List   Diagnosis Date Noted   Status post total replacement of left hip 11/01/2022   Primary osteoarthritis of left hip 10/31/2022   Painful orthopaedic hardware ([place]) 09/28/2018   Closed displaced comminuted fracture of shaft of left tibia 07/01/2017   Bimalleolar ankle fracture, left, closed, initial encounter 07/01/2017   History of open reduction and internal fixation (ORIF) procedure 07/01/2017   History of breast cancer 05/24/2013   Past Medical History:  Diagnosis Date   Arthritis     "right hip; hands" (07/01/2017)   Cancer of left breast ([place]) 08/2003   S/p left breast mastectomy   Carpal tunnel syndrome, bilateral    PONV (postoperative nausea and vomiting)     Family History  Problem Relation Age of Onset   Alzheimer's disease Mother    Seizures Mother     Past Surgical History:  Procedure Laterality Date   BREAST BIOPSY Left 2005   CARPAL TUNNEL RELEASE Bilateral    COLONOSCOPY     FRACTURE SURGERY  07/01/2017   MASTECTOMY Left 10/14/2003   MASTECTOMY WITH AXILLARY LYMPH NODE DISSECTION Left 10/14/2003   OPEN REDUCTION INTERNAL FIXATION (ORIF) TIBIA/FIBULA FRACTURE Left 07/01/2017   OPEN REDUCTION INTERNAL FIXATION (ORIF) LEFT TIBIA/PILON FRACTURE/FIBULA FRACTURE    ORIF ANKLE FRACTURE Left 07/01/2017   Procedure: OPEN REDUCTION INTERNAL FIXATION (ORIF) LEFT TIBIA/PILON FRACTURE/FIBULA FRACTURE;  Surgeon: Leandrew Koyanagi, MD;  Location: [place];  Service: Orthopedics;  Laterality: Left;   RECONSTRUCTION BREAST W/ TRAM FLAP Left 10/14/2003   TOTAL HIP ARTHROPLASTY Left 11/01/2022   Procedure: LEFT TOTAL HIP ARTHROPLASTY ANTERIOR APPROACH;  Surgeon: Leandrew Koyanagi, MD;  Location: [place];  Service: Orthopedics;  Laterality: Left;  3-C   Social History   Occupational History   Not on file  Tobacco Use   Smoking status: Never   Smokeless tobacco: Never  Vaping Use   Vaping Use: Never used  Substance  and Sexual Activity   Alcohol use: Yes    Alcohol/week: 6.0 standard drinks of alcohol    Types: 6 Glasses of wine per week   Drug use: No   Sexual activity: Yes    Birth control/protection: Post-menopausal

## 2022-11-23 ENCOUNTER — Other Ambulatory Visit (HOSPITAL_BASED_OUTPATIENT_CLINIC_OR_DEPARTMENT_OTHER): Payer: Self-pay

## 2022-11-23 ENCOUNTER — Other Ambulatory Visit (HOSPITAL_COMMUNITY): Payer: Self-pay

## 2022-11-23 ENCOUNTER — Other Ambulatory Visit (HOSPITAL_BASED_OUTPATIENT_CLINIC_OR_DEPARTMENT_OTHER): Payer: Self-pay | Admitting: Registered Nurse

## 2022-11-23 DIAGNOSIS — R051 Acute cough: Secondary | ICD-10-CM

## 2022-11-23 DIAGNOSIS — R9389 Abnormal findings on diagnostic imaging of other specified body structures: Secondary | ICD-10-CM | POA: Diagnosis not present

## 2022-11-23 DIAGNOSIS — E785 Hyperlipidemia, unspecified: Secondary | ICD-10-CM | POA: Diagnosis not present

## 2022-11-23 DIAGNOSIS — J40 Bronchitis, not specified as acute or chronic: Secondary | ICD-10-CM | POA: Diagnosis not present

## 2022-11-23 MED ORDER — PANTOPRAZOLE SODIUM 40 MG PO TBEC
40.0000 mg | DELAYED_RELEASE_TABLET | Freq: Every day | ORAL | 1 refills | Status: DC
  Filled 2022-11-23: qty 30, 30d supply, fill #0

## 2022-11-23 MED ORDER — ROSUVASTATIN CALCIUM 10 MG PO TABS
10.0000 mg | ORAL_TABLET | Freq: Every day | ORAL | 3 refills | Status: DC
  Filled 2022-11-23: qty 90, 90d supply, fill #0
  Filled 2022-11-23 – 2023-01-31 (×2): qty 90, 90d supply, fill #1
  Filled 2023-05-17: qty 90, 90d supply, fill #2
  Filled 2023-07-15 – 2023-08-16 (×2): qty 90, 90d supply, fill #3

## 2022-11-24 ENCOUNTER — Ambulatory Visit (HOSPITAL_BASED_OUTPATIENT_CLINIC_OR_DEPARTMENT_OTHER)
Admission: RE | Admit: 2022-11-24 | Discharge: 2022-11-24 | Disposition: A | Discharge status: Home / Self Care | Payer: PPO | Source: Ambulatory Visit | Attending: Registered Nurse | Admitting: Registered Nurse

## 2022-11-24 ENCOUNTER — Encounter (HOSPITAL_BASED_OUTPATIENT_CLINIC_OR_DEPARTMENT_OTHER): Payer: Self-pay

## 2022-11-24 DIAGNOSIS — J479 Bronchiectasis, uncomplicated: Secondary | ICD-10-CM | POA: Diagnosis not present

## 2022-11-24 DIAGNOSIS — R9389 Abnormal findings on diagnostic imaging of other specified body structures: Secondary | ICD-10-CM | POA: Insufficient documentation

## 2022-11-24 DIAGNOSIS — R918 Other nonspecific abnormal finding of lung field: Secondary | ICD-10-CM | POA: Diagnosis not present

## 2022-11-24 DIAGNOSIS — R051 Acute cough: Secondary | ICD-10-CM | POA: Insufficient documentation

## 2022-11-24 MED ORDER — IOHEXOL 300 MG/ML  SOLN
100.0000 mL | Freq: Once | INTRAMUSCULAR | Status: AC | PRN
  Administered 2022-11-24: 60 mL via INTRAVENOUS

## 2022-11-29 ENCOUNTER — Other Ambulatory Visit (HOSPITAL_COMMUNITY): Payer: Self-pay

## 2022-12-01 ENCOUNTER — Other Ambulatory Visit (HOSPITAL_BASED_OUTPATIENT_CLINIC_OR_DEPARTMENT_OTHER): Payer: Self-pay

## 2022-12-01 ENCOUNTER — Other Ambulatory Visit: Payer: Self-pay | Admitting: Internal Medicine

## 2022-12-01 DIAGNOSIS — Z1231 Encounter for screening mammogram for malignant neoplasm of breast: Secondary | ICD-10-CM

## 2022-12-01 MED ORDER — HYDROCODONE BIT-HOMATROP MBR 5-1.5 MG/5ML PO SOLN
5.0000 mL | Freq: Four times a day (QID) | ORAL | 0 refills | Status: AC | PRN
  Filled 2022-12-01: qty 200, 10d supply, fill #0

## 2022-12-09 DIAGNOSIS — D225 Melanocytic nevi of trunk: Secondary | ICD-10-CM | POA: Diagnosis not present

## 2022-12-09 DIAGNOSIS — L820 Inflamed seborrheic keratosis: Secondary | ICD-10-CM | POA: Diagnosis not present

## 2022-12-09 DIAGNOSIS — L814 Other melanin hyperpigmentation: Secondary | ICD-10-CM | POA: Diagnosis not present

## 2022-12-09 DIAGNOSIS — L821 Other seborrheic keratosis: Secondary | ICD-10-CM | POA: Diagnosis not present

## 2022-12-09 DIAGNOSIS — L905 Scar conditions and fibrosis of skin: Secondary | ICD-10-CM | POA: Diagnosis not present

## 2022-12-14 ENCOUNTER — Other Ambulatory Visit (HOSPITAL_BASED_OUTPATIENT_CLINIC_OR_DEPARTMENT_OTHER): Payer: Self-pay

## 2022-12-14 ENCOUNTER — Telehealth: Payer: Self-pay | Admitting: *Deleted

## 2022-12-14 ENCOUNTER — Other Ambulatory Visit (INDEPENDENT_AMBULATORY_CARE_PROVIDER_SITE_OTHER): Payer: PPO

## 2022-12-14 ENCOUNTER — Ambulatory Visit (INDEPENDENT_AMBULATORY_CARE_PROVIDER_SITE_OTHER): Payer: PPO | Admitting: Orthopaedic Surgery

## 2022-12-14 DIAGNOSIS — Z96642 Presence of left artificial hip joint: Secondary | ICD-10-CM

## 2022-12-14 MED ORDER — AMOXICILLIN 500 MG PO CAPS
2000.0000 mg | ORAL_CAPSULE | Freq: Once | ORAL | 6 refills | Status: DC
  Filled 2022-12-14: qty 4, 1d supply, fill #0
  Filled 2023-02-16: qty 4, 1d supply, fill #1
  Filled 2023-09-07: qty 4, 1d supply, fill #2

## 2022-12-14 NOTE — Telephone Encounter (Signed)
Ortho bundle in office meeting completed. 

## 2022-12-14 NOTE — Progress Notes (Signed)
Post-Op Visit Note   Patient: [name]           Date of Birth: 06-05-1951           MRN: [id number] Visit Date: 12/14/2022 PCP: Creola Corn, MD   Assessment & Plan:  Chief Complaint:  Chief Complaint  Patient presents with   Left Hip - Follow-up    Left total hip arthroplasty 11/01/2022   Visit Diagnoses:  1. Status post total replacement of left hip     Plan: Patient is 6 weeks status post left total hip replacement.  She is doing well overall.  Really wants to get back into the pool.  She has no complaints.  Examination of left hip shows fully healed surgical scar.  Fluid painless range of motion.  Equal leg lengths.  She has a good gait.  The implant is in good position without any complications.  She can be in the pool as long as she covers the scar with a Tegaderm which we provided her with a few today.  Amoxicillin prescribed for upcoming dentist visit.  Recheck in 6 weeks for 52-month visit  Follow-Up Instructions: Return in about 6 weeks (around 01/25/2023) for f/u appt wtih lindsey .   Orders:  Orders Placed This Encounter  Procedures   XR Pelvis 1-2 Views   Meds ordered this encounter  Medications   amoxicillin (AMOXIL) 500 MG capsule    Sig: Take 4 capsules (2,000 mg total) by mouth once for 1 dose.    Dispense:  4 capsule    Refill:  6    Imaging: XR Pelvis 1-2 Views  Result Date: 12/14/2022 Stable left total hip replacement without complications   PMFS History: Patient Active Problem List   Diagnosis Date Noted   Status post total replacement of left hip 11/01/2022   Primary osteoarthritis of left hip 10/31/2022   Painful orthopaedic hardware 09/28/2018   Closed displaced comminuted fracture of shaft of left tibia 07/01/2017   Bimalleolar ankle fracture, left, closed, initial encounter 07/01/2017   History of open reduction and internal fixation (ORIF) procedure 07/01/2017   History of breast cancer 05/24/2013   Past Medical History:   Diagnosis Date   Arthritis    "right hip; hands" (07/01/2017)   Cancer of left breast (HCC) 08/2003   S/p left breast mastectomy   Carpal tunnel syndrome, bilateral    PONV (postoperative nausea and vomiting)     Family History  Problem Relation Age of Onset   Alzheimer's disease Mother    Seizures Mother     Past Surgical History:  Procedure Laterality Date   BREAST BIOPSY Left 2005   CARPAL TUNNEL RELEASE Bilateral    COLONOSCOPY     FRACTURE SURGERY  07/01/2017   MASTECTOMY Left 10/14/2003   MASTECTOMY WITH AXILLARY LYMPH NODE DISSECTION Left 10/14/2003   OPEN REDUCTION INTERNAL FIXATION (ORIF) TIBIA/FIBULA FRACTURE Left 07/01/2017   OPEN REDUCTION INTERNAL FIXATION (ORIF) LEFT TIBIA/PILON FRACTURE/FIBULA FRACTURE    ORIF ANKLE FRACTURE Left 07/01/2017   Procedure: OPEN REDUCTION INTERNAL FIXATION (ORIF) LEFT TIBIA/PILON FRACTURE/FIBULA FRACTURE;  Surgeon: Tarry Kos, MD;  Location: [facility] OR;  Service: Orthopedics;  Laterality: Left;   RECONSTRUCTION BREAST W/ TRAM FLAP Left 10/14/2003   TOTAL HIP ARTHROPLASTY Left 11/01/2022   Procedure: LEFT TOTAL HIP ARTHROPLASTY ANTERIOR APPROACH;  Surgeon: Tarry Kos, MD;  Location: [facility] OR;  Service: Orthopedics;  Laterality: Left;  3-C   Social History   Occupational History   Not  on file  Tobacco Use   Smoking status: Never   Smokeless tobacco: Never  Vaping Use   Vaping Use: Never used  Substance and Sexual Activity   Alcohol use: Yes    Alcohol/week: 6.0 standard drinks of alcohol    Types: 6 Glasses of wine per week   Drug use: No   Sexual activity: Yes    Birth control/protection: Post-menopausal

## 2022-12-15 ENCOUNTER — Other Ambulatory Visit (HOSPITAL_BASED_OUTPATIENT_CLINIC_OR_DEPARTMENT_OTHER): Payer: Self-pay

## 2022-12-23 ENCOUNTER — Ambulatory Visit (INDEPENDENT_AMBULATORY_CARE_PROVIDER_SITE_OTHER): Payer: PPO | Admitting: Emergency Medicine

## 2022-12-23 ENCOUNTER — Other Ambulatory Visit (HOSPITAL_BASED_OUTPATIENT_CLINIC_OR_DEPARTMENT_OTHER): Payer: Self-pay

## 2022-12-23 ENCOUNTER — Encounter: Payer: Self-pay | Admitting: Emergency Medicine

## 2022-12-23 VITALS — BP 132/68 | HR 85 | Temp 98.3°F | Ht 67.0 in | Wt 176.4 lb

## 2022-12-23 DIAGNOSIS — A310 Pulmonary mycobacterial infection: Secondary | ICD-10-CM

## 2022-12-23 DIAGNOSIS — R053 Chronic cough: Secondary | ICD-10-CM | POA: Diagnosis not present

## 2022-12-23 DIAGNOSIS — R9389 Abnormal findings on diagnostic imaging of other specified body structures: Secondary | ICD-10-CM | POA: Insufficient documentation

## 2022-12-23 MED ORDER — PANTOPRAZOLE SODIUM 40 MG PO TBEC
DELAYED_RELEASE_TABLET | ORAL | 0 refills | Status: DC
  Filled 2022-12-23: qty 42, 30d supply, fill #0

## 2022-12-23 NOTE — Patient Instructions (Signed)
We reviewed your CT scan of the chest today. We will perform full pulmonary function testing Please start pantoprazole 40 mg twice a day for 2 weeks.  Take this medication 1 hour around food.  After 2 weeks decrease to 1 time daily for another 2 weeks.  At that point you can stop. Continue your loratadine 10 mg once daily. We will try to obtain a sputum culture Depending on how your cough is doing, whether you are able to give a sputum culture, we will decide going forward whether there would be some benefit to bronchoscopy. Follow Dr. Delton Coombes next available with pulmonary function testing on the same day.

## 2022-12-23 NOTE — Progress Notes (Signed)
Subjective:    Patient ID: [name], female    DOB: December 02, 1950, 72 y.o.   MRN: [id number]  HPI 72 year old never smoker with a history of left breast cancer (mastectomy,no XRT), carpal tunnel, osteoarthritis, allergic rhinitis, hyperlipidemia. She has been experiencing chronic cough. She reports that she was well, began to notice cough in January, no sputum. CXR at that time was reassuring. She was treated w abx. Trial of PPI x 2 weeks > no change in the cough. Believes she was also treated w steroids. Has used DM, guaifenesin. Using some hydrocodone qhs. The cough was bothering her at night. She may now be producing some clear mucous. No real congestion or allergies. She had a hip replacement on 11/01/22.   CT chest 11/24/2022 reviewed by me shows no mediastinal or hilar adenopathy.  There is some mild tree-in-bud opacity in the inferior right upper lobe, right middle lobe and lower lobes with some focal bronchiectasis in the right upper lobe, right middle lobe, lingula.   Review of Systems As per HPI  Past Medical History:  Diagnosis Date   Arthritis    "right hip; hands" (07/01/2017)   Cancer of left breast (HCC) 08/2003   S/p left breast mastectomy   Carpal tunnel syndrome, bilateral    PONV (postoperative nausea and vomiting)      Family History  Problem Relation Age of Onset   Alzheimer's disease Mother    Seizures Mother      Social History   Socioeconomic History   Marital status: Married    Spouse name: Dorinda Hill   Number of children: 2   Years of education: Not on file   Highest education level: Not on file  Occupational History   Not on file  Tobacco Use   Smoking status: Never   Smokeless tobacco: Never  Vaping Use   Vaping Use: Never used  Substance and Sexual Activity   Alcohol use: Yes    Alcohol/week: 6.0 standard drinks of alcohol    Types: 6 Glasses of wine per week   Drug use: No   Sexual activity: Yes    Birth control/protection: Post-menopausal   Other Topics Concern   Not on file  Social History Narrative   Not on file   Social Determinants of Health   Financial Resource Strain: Not on file  Food Insecurity: No Food Insecurity (11/01/2022)   Hunger Vital Sign    Worried About Running Out of Food in the Last Year: Never true    Ran Out of Food in the Last Year: Never true  Transportation Needs: No Transportation Needs (11/01/2022)   PRAPARE - Administrator, Civil Service (Medical): No    Lack of Transportation (Non-Medical): No  Physical Activity: Not on file  Stress: Not on file  Social Connections: Not on file  Intimate Partner Violence: Not At Risk (11/01/2022)   Humiliation, Afraid, Rape, and Kick questionnaire    Fear of Current or Ex-Partner: No    Emotionally Abused: No    Physically Abused: No    Sexually Abused: No     No Known Allergies   Outpatient Medications Prior to Visit  Medication Sig Dispense Refill   Calcium Carb-Cholecalciferol (CALCIUM 600+D) 600-10 MG-MCG TABS Take 1 tablet by mouth in the morning and at bedtime.     Cholecalciferol (VITAMIN D) 2000 UNITS tablet Take 2,000 Units by mouth daily.     dextromethorphan (DELSYM) 30 MG/5ML liquid      loratadine (CLARITIN)  10 MG tablet Take 10 mg by mouth daily.     Multiple Vitamins-Minerals (MULTIVITAMIN WITH MINERALS) tablet Take 1 tablet by mouth daily.     rosuvastatin (CRESTOR) 10 MG tablet Take 1 tablet (10 mg total) by mouth daily. 90 tablet 3   No facility-administered medications prior to visit.        Objective:   Physical Exam  Vitals:   12/23/22 1437 12/23/22 1439  BP: 132/68 132/68  Pulse:  85  Temp:  98.3 F (36.8 C)  TempSrc:  Oral  SpO2:  97%  Weight:  176 lb 6.4 oz (80 kg)  Height:   (1.702 m)   Gen: Pleasant, well-nourished, in no distress,  normal affect, frequent throat clearing and occasional cough  ENT: No lesions,  mouth clear,  oropharynx clear, no postnasal drip  Neck: No JVD, no  stridor  Lungs: No use of accessory muscles, no crackles or wheezing on normal respiration, no wheeze on forced expiration.  She does cough with a deep breath  Cardiovascular: RRR, heart sounds normal, no murmur or gallops, no peripheral edema  Musculoskeletal: No deformities, no cyanosis or clubbing  Neuro: alert, awake, non focal  Skin: Warm, no lesions or rash      Assessment & Plan:   Abnormal CT of the chest CT was performed because she had persistent cough without any clear trigger.  Shows scattered focal tree-in-bud micronodular disease with some associated bronchiectasis, consistent with an indolent inflammatory process.  She does not have any history of autoimmune disease.  Suspect that this is an opportunistic process like Mycobacterium avium.  Certainly could correlate with her cough which has not responded to attempts at treatment.  Certainly will need to follow with serial imaging.  I believe she needs definitive culture data.  We will try to obtain sputum for AFB culture but if unsuccessful then would favor bronchoscopy and BAL.  Chronic cough Some of this is upper airway in nature.  She has a lot of throat clearing and the cough is dry.  Certainly could also be related to the abnormalities noted on CT above, particularly if this is Mycobacterium avium.  While we try to get culture information, workup the CT findings, I believe would be reasonable to try again to treat any possible contributing factors.  She is already on loratadine and minimizes any contribution of rhinitis.  We will try to aggressively treat any contribution of occult GERD to rule this out as a factor.  Still may ultimately require bronchoscopy as above to evaluate for other causes.   Levy Pupa, MD, PhD 12/23/2022, 4:36 PM [facility] [phone] or if no answer before 7:00PM call [phone] For any issues after 7:00PM please call eLink [phone]

## 2022-12-23 NOTE — Assessment & Plan Note (Signed)
CT was performed because she had persistent cough without any clear trigger.  Shows scattered focal tree-in-bud micronodular disease with some associated bronchiectasis, consistent with an indolent inflammatory process.  She does not have any history of autoimmune disease.  Suspect that this is an opportunistic process like Mycobacterium avium.  Certainly could correlate with her cough which has not responded to attempts at treatment.  Certainly will need to follow with serial imaging.  I believe she needs definitive culture data.  We will try to obtain sputum for AFB culture but if unsuccessful then would favor bronchoscopy and BAL.

## 2022-12-23 NOTE — Assessment & Plan Note (Signed)
Some of this is upper airway in nature.  She has a lot of throat clearing and the cough is dry.  Certainly could also be related to the abnormalities noted on CT above, particularly if this is Mycobacterium avium.  While we try to get culture information, workup the CT findings, I believe would be reasonable to try again to treat any possible contributing factors.  She is already on loratadine and minimizes any contribution of rhinitis.  We will try to aggressively treat any contribution of occult GERD to rule this out as a factor.  Still may ultimately require bronchoscopy as above to evaluate for other causes.

## 2022-12-24 ENCOUNTER — Other Ambulatory Visit (HOSPITAL_BASED_OUTPATIENT_CLINIC_OR_DEPARTMENT_OTHER): Payer: Self-pay

## 2022-12-24 MED ORDER — HYDROCODONE BIT-HOMATROP MBR 5-1.5 MG/5ML PO SOLN
5.0000 mL | Freq: Four times a day (QID) | ORAL | 0 refills | Status: DC
  Filled 2022-12-24: qty 200, 10d supply, fill #0

## 2023-01-16 ENCOUNTER — Other Ambulatory Visit (HOSPITAL_BASED_OUTPATIENT_CLINIC_OR_DEPARTMENT_OTHER): Payer: Self-pay

## 2023-01-17 ENCOUNTER — Other Ambulatory Visit (HOSPITAL_BASED_OUTPATIENT_CLINIC_OR_DEPARTMENT_OTHER): Payer: Self-pay

## 2023-01-17 MED ORDER — HYDROCODONE BIT-HOMATROP MBR 5-1.5 MG/5ML PO SOLN
ORAL | 0 refills | Status: DC
  Filled 2023-01-17: qty 200, 10d supply, fill #0

## 2023-01-25 ENCOUNTER — Encounter: Payer: Self-pay | Admitting: Physician Assistant

## 2023-01-25 ENCOUNTER — Ambulatory Visit (INDEPENDENT_AMBULATORY_CARE_PROVIDER_SITE_OTHER): Payer: PPO | Admitting: Physician Assistant

## 2023-01-25 DIAGNOSIS — Z96642 Presence of left artificial hip joint: Secondary | ICD-10-CM

## 2023-01-25 NOTE — Progress Notes (Signed)
Post-Op Visit Note   Patient: [name]           Date of Birth: [birth year]           MRN: [id number] Visit Date: 01/25/2023 PCP: Creola Corn, MD   Assessment & Plan:  Chief Complaint:  Chief Complaint  Patient presents with   Left Hip - Follow-up    Left total hip arthroplasty 11/01/2022   Visit Diagnoses:  1. Status post total replacement of left hip     Plan: Patient is a very pleasant 72 year old female who comes in today approximately 12 weeks status post left total hip replacement 11/01/2022.  She has been doing well.  No pain or any other complaints.  Examination left hip reveals a painless hip flexion and logroll.  She is neurovascularly intact distally.  At this point, she may continue to advance with activity as tolerated.  Follow-up in 3 months for repeat evaluation and AP pelvis x-rays.  Call with concerns or questions.  Follow-Up Instructions: Return in about 3 months (around 04/27/2023).   Orders:  No orders of the defined types were placed in this encounter.  No orders of the defined types were placed in this encounter.   Imaging: No new imaging  PMFS History: Patient Active Problem List   Diagnosis Date Noted   Chronic cough 12/23/2022   Abnormal CT of the chest 12/23/2022   Status post total replacement of left hip 11/01/2022   Primary osteoarthritis of left hip 10/31/2022   Painful orthopaedic hardware (HCC) 09/28/2018   Closed displaced comminuted fracture of shaft of left tibia 07/01/2017   Bimalleolar ankle fracture, left, closed, initial encounter 07/01/2017   History of open reduction and internal fixation (ORIF) procedure 07/01/2017   History of breast cancer 05/24/2013   Past Medical History:  Diagnosis Date   Arthritis    "right hip; hands" (07/01/2017)   Cancer of left breast (HCC) 08/2003   S/p left breast mastectomy   Carpal tunnel syndrome, bilateral    PONV (postoperative nausea and vomiting)     Family History  Problem Relation  Age of Onset   Alzheimer's disease Mother    Seizures Mother     Past Surgical History:  Procedure Laterality Date   BREAST BIOPSY Left 2005   CARPAL TUNNEL RELEASE Bilateral    COLONOSCOPY     FRACTURE SURGERY  07/01/2017   MASTECTOMY Left 10/14/2003   MASTECTOMY WITH AXILLARY LYMPH NODE DISSECTION Left 10/14/2003   OPEN REDUCTION INTERNAL FIXATION (ORIF) TIBIA/FIBULA FRACTURE Left 07/01/2017   OPEN REDUCTION INTERNAL FIXATION (ORIF) LEFT TIBIA/PILON FRACTURE/FIBULA FRACTURE    ORIF ANKLE FRACTURE Left 07/01/2017   Procedure: OPEN REDUCTION INTERNAL FIXATION (ORIF) LEFT TIBIA/PILON FRACTURE/FIBULA FRACTURE;  Surgeon: Tarry Kos, MD;  Location: [facility] OR;  Service: Orthopedics;  Laterality: Left;   RECONSTRUCTION BREAST W/ TRAM FLAP Left 10/14/2003   TOTAL HIP ARTHROPLASTY Left 11/01/2022   Procedure: LEFT TOTAL HIP ARTHROPLASTY ANTERIOR APPROACH;  Surgeon: Tarry Kos, MD;  Location: [facility] OR;  Service: Orthopedics;  Laterality: Left;  3-C   Social History   Occupational History   Not on file  Tobacco Use   Smoking status: Never   Smokeless tobacco: Never  Vaping Use   Vaping Use: Never used  Substance and Sexual Activity   Alcohol use: Yes    Alcohol/week: 6.0 standard drinks of alcohol    Types: 6 Glasses of wine per week   Drug use: No   Sexual activity: Yes  Birth control/protection: Post-menopausal

## 2023-02-07 ENCOUNTER — Other Ambulatory Visit: Payer: Self-pay | Admitting: Internal Medicine

## 2023-02-07 ENCOUNTER — Ambulatory Visit
Admission: RE | Admit: 2023-02-07 | Discharge: 2023-02-07 | Disposition: A | Discharge status: Home / Self Care | Payer: PPO | Source: Ambulatory Visit | Attending: Internal Medicine | Admitting: Internal Medicine

## 2023-02-07 ENCOUNTER — Encounter (HOSPITAL_BASED_OUTPATIENT_CLINIC_OR_DEPARTMENT_OTHER): Payer: Self-pay

## 2023-02-07 DIAGNOSIS — Z1231 Encounter for screening mammogram for malignant neoplasm of breast: Secondary | ICD-10-CM | POA: Diagnosis not present

## 2023-02-09 LAB — AFB CULTURE WITH SMEAR (NOT AT ARMC)
Acid Fast Culture: NEGATIVE
Acid Fast Smear: NEGATIVE

## 2023-02-10 ENCOUNTER — Other Ambulatory Visit (HOSPITAL_BASED_OUTPATIENT_CLINIC_OR_DEPARTMENT_OTHER): Payer: Self-pay

## 2023-02-10 MED ORDER — HYDROCODONE BIT-HOMATROP MBR 5-1.5 MG/5ML PO SOLN
5.0000 mL | Freq: Four times a day (QID) | ORAL | 0 refills | Status: DC | PRN
  Filled 2023-02-10: qty 200, 10d supply, fill #0

## 2023-02-15 ENCOUNTER — Encounter (HOSPITAL_BASED_OUTPATIENT_CLINIC_OR_DEPARTMENT_OTHER): Payer: PPO

## 2023-02-15 ENCOUNTER — Ambulatory Visit: Payer: PPO | Admitting: Emergency Medicine

## 2023-02-16 ENCOUNTER — Other Ambulatory Visit (HOSPITAL_BASED_OUTPATIENT_CLINIC_OR_DEPARTMENT_OTHER): Payer: Self-pay

## 2023-03-10 ENCOUNTER — Other Ambulatory Visit (HOSPITAL_BASED_OUTPATIENT_CLINIC_OR_DEPARTMENT_OTHER): Payer: Self-pay

## 2023-03-10 MED ORDER — HYDROCODONE BIT-HOMATROP MBR 5-1.5 MG/5ML PO SOLN
5.0000 mL | Freq: Four times a day (QID) | ORAL | 0 refills | Status: DC | PRN
  Filled 2023-03-10: qty 200, 10d supply, fill #0

## 2023-03-11 ENCOUNTER — Other Ambulatory Visit (HOSPITAL_COMMUNITY): Payer: Self-pay

## 2023-04-05 ENCOUNTER — Other Ambulatory Visit (HOSPITAL_BASED_OUTPATIENT_CLINIC_OR_DEPARTMENT_OTHER): Payer: Self-pay

## 2023-04-05 MED ORDER — HYDROCODONE BIT-HOMATROP MBR 5-1.5 MG/5ML PO SOLN
5.0000 mL | Freq: Four times a day (QID) | ORAL | 0 refills | Status: DC | PRN
  Filled 2023-04-05: qty 200, 10d supply, fill #0

## 2023-04-06 ENCOUNTER — Other Ambulatory Visit (HOSPITAL_BASED_OUTPATIENT_CLINIC_OR_DEPARTMENT_OTHER): Payer: Self-pay

## 2023-04-12 ENCOUNTER — Ambulatory Visit (INDEPENDENT_AMBULATORY_CARE_PROVIDER_SITE_OTHER): Payer: PPO | Admitting: Emergency Medicine

## 2023-04-12 DIAGNOSIS — A310 Pulmonary mycobacterial infection: Secondary | ICD-10-CM | POA: Diagnosis not present

## 2023-04-12 LAB — PULMONARY FUNCTION TEST
DL/VA % pred: 93 %
DL/VA: 3.78 ml/min/mmHg/L
DLCO cor % pred: 87 %
DLCO cor: 18.56 ml/min/mmHg
DLCO unc % pred: 87 %
DLCO unc: 18.56 ml/min/mmHg
FEF 25-75 Post: 2.76 L/sec
FEF 25-75 Pre: 2.43 L/sec
FEF2575-%Change-Post: 13 %
FEF2575-%Pred-Post: 143 %
FEF2575-%Pred-Pre: 125 %
FEV1-%Change-Post: 2 %
FEV1-%Pred-Post: 105 %
FEV1-%Pred-Pre: 102 %
FEV1-Post: 2.59 L
FEV1-Pre: 2.53 L
FEV1FVC-%Change-Post: 3 %
FEV1FVC-%Pred-Pre: 106 %
FEV6-%Change-Post: 0 %
FEV6-%Pred-Post: 100 %
FEV6-%Pred-Pre: 101 %
FEV6-Post: 3.12 L
FEV6-Pre: 3.15 L
FEV6FVC-%Change-Post: 0 %
FEV6FVC-%Pred-Post: 104 %
FEV6FVC-%Pred-Pre: 104 %
FVC-%Change-Post: 0 %
FVC-%Pred-Post: 95 %
FVC-%Pred-Pre: 96 %
FVC-Post: 3.13 L
FVC-Pre: 3.16 L
Post FEV1/FVC ratio: 83 %
Post FEV6/FVC ratio: 100 %
Pre FEV1/FVC ratio: 80 %
Pre FEV6/FVC Ratio: 100 %
RV % pred: 124 %
RV: 2.99 L
TLC % pred: 111 %
TLC: 6.15 L

## 2023-04-12 NOTE — Patient Instructions (Signed)
Full PFT performed today. °

## 2023-04-12 NOTE — Progress Notes (Signed)
Full PFT performed today. °

## 2023-04-13 ENCOUNTER — Encounter (HOSPITAL_BASED_OUTPATIENT_CLINIC_OR_DEPARTMENT_OTHER): Payer: PPO

## 2023-04-13 ENCOUNTER — Encounter: Payer: Self-pay | Admitting: Emergency Medicine

## 2023-04-13 ENCOUNTER — Ambulatory Visit: Payer: PPO | Admitting: Emergency Medicine

## 2023-04-13 VITALS — BP 122/62 | HR 78 | Resp 14 | Ht 67.0 in | Wt 189.6 lb

## 2023-04-13 DIAGNOSIS — R053 Chronic cough: Secondary | ICD-10-CM | POA: Diagnosis not present

## 2023-04-13 MED ORDER — PANTOPRAZOLE SODIUM 40 MG PO TBEC: 40.0000 mg | DELAYED_RELEASE_TABLET | Freq: Every day | ORAL | 3 refills | Status: AC

## 2023-04-13 MED ORDER — FLUTICASONE PROPIONATE 50 MCG/ACT NA SUSP: 2.0000 | Freq: Every day | NASAL | 2 refills | Status: AC

## 2023-04-13 NOTE — H&P (View-Only) (Signed)
Subjective:    Patient ID: [name], female    DOB: 1950/11/01, 72 y.o.   MRN: [id number]  HPI 72 year old never smoker with a history of left breast cancer (mastectomy,no XRT), carpal tunnel, osteoarthritis, allergic rhinitis, hyperlipidemia. She has been experiencing chronic cough. She reports that she was well, began to notice cough in January, no sputum. CXR at that time was reassuring. She was treated w abx. Trial of PPI x 2 weeks > no change in the cough. Believes she was also treated w steroids. Has used DM, guaifenesin. Using some hydrocodone qhs. The cough was bothering her at night. She may now be producing some clear mucous. No real congestion or allergies. She had a hip replacement on 11/01/22.   CT chest 11/24/2022 reviewed by me shows no mediastinal or hilar adenopathy.  There is some mild tree-in-bud opacity in the inferior right upper lobe, right middle lobe and lower lobes with some focal bronchiectasis in the right upper lobe, right middle lobe, lingula.    ROV 04/13/2023 --72 year old woman with a history of left breast cancer whom I have seen for bronchiectasis, tree-in-bud infiltrates and chronic cough.  We obtain sputum cultures 12/23/22 that were all negative for AFB.  She was on loratadine and I tried adding PPI to see if she would get any benefit - she is unsure whether it helped. Sometimes her cough does seem to associate with meals.  Pulmonary function testing was performed as below  Pulm function testing 04/12/2023 reviewed by me shows normal airflows with no bronchodilator response.  Her lung volumes are normal.  Diffusion capacity normal.  Flow volume loop is normal in appearance.  Review of Systems As per HPI  Past Medical History:  Diagnosis Date   Arthritis    "right hip; hands" (07/01/2017)   Cancer of left breast (HCC) 08/2003   S/p left breast mastectomy   Carpal tunnel syndrome, bilateral    PONV (postoperative nausea and vomiting)      Family History   Problem Relation Age of Onset   Alzheimer's disease Mother    Seizures Mother      Social History   Socioeconomic History   Marital status: Married    Spouse name: Dorinda Hill   Number of children: 2   Years of education: Not on file   Highest education level: Not on file  Occupational History   Not on file  Tobacco Use   Smoking status: Never   Smokeless tobacco: Never  Vaping Use   Vaping status: Never Used  Substance and Sexual Activity   Alcohol use: Yes    Alcohol/week: 6.0 standard drinks of alcohol    Types: 6 Glasses of wine per week   Drug use: No   Sexual activity: Yes    Birth control/protection: Post-menopausal  Other Topics Concern   Not on file  Social History Narrative   Not on file   Social Determinants of Health   Financial Resource Strain: Not on file  Food Insecurity: No Food Insecurity (11/01/2022)   Hunger Vital Sign    Worried About Running Out of Food in the Last Year: Never true    Ran Out of Food in the Last Year: Never true  Transportation Needs: No Transportation Needs (11/01/2022)   PRAPARE - Administrator, Civil Service (Medical): No    Lack of Transportation (Non-Medical): No  Physical Activity: Not on file  Stress: Not on file  Social Connections: Not on file  Intimate Partner  Violence: Not At Risk (11/01/2022)   Humiliation, Afraid, Rape, and Kick questionnaire    Fear of Current or Ex-Partner: No    Emotionally Abused: No    Physically Abused: No    Sexually Abused: No     No Known Allergies   Outpatient Medications Prior to Visit  Medication Sig Dispense Refill   Calcium Carb-Cholecalciferol (CALCIUM 600+D) 600-10 MG-MCG TABS Take 1 tablet by mouth in the morning and at bedtime.     Cholecalciferol (VITAMIN D) 2000 UNITS tablet Take 2,000 Units by mouth daily.     Dextromethorphan-Menthol (DELSYM COUGH RELIEF MT) Use as directed in the mouth or throat.     HYDROcodone bit-homatropine (HYDROMET) 5-1.5 MG/5ML syrup Take 5  mLs by mouth every 6 (six) hours as needed. 200 mL 0   loratadine (CLARITIN) 10 MG tablet Take 10 mg by mouth daily.     Multiple Vitamins-Minerals (MULTIVITAMIN WITH MINERALS) tablet Take 1 tablet by mouth daily.     rosuvastatin (CRESTOR) 10 MG tablet Take 1 tablet (10 mg total) by mouth daily. 90 tablet 3   dextromethorphan (DELSYM) 30 MG/5ML liquid      HYDROcodone bit-homatropine (HYDROMET) 5-1.5 MG/5ML syrup Take 5 mL as needed Orally every 6 hrs for 10 days 200 mL 0   HYDROcodone bit-homatropine (HYDROMET) 5-1.5 MG/5ML syrup Take 5 mLs by mouth every 6 (six) hours as needed for 10 days. 200 mL 0   HYDROcodone bit-homatropine (HYDROMET) 5-1.5 MG/5ML syrup Take 5 mLs by mouth every 6 (six) hours as needed for 10 days. 200 mL 0   pantoprazole (PROTONIX) 40 MG tablet Take 1 tablet twice daily for 2 weeks, then 1 tablet daily for 2 weeks and then stop. 42 tablet 0   No facility-administered medications prior to visit.        Objective:   Physical Exam  Vitals:   04/13/23 1352  BP: 122/62  Pulse: 78  Resp: 14  SpO2: 96%  Weight: 189 lb 9.6 oz (86 kg)  Height: 5\' 7"  (1.702 m)   Gen: Pleasant, well-nourished, in no distress,  normal affect, frequent throat clearing and occasional cough  ENT: No lesions,  mouth clear,  oropharynx clear, no postnasal drip  Neck: No JVD, no stridor  Lungs: No use of accessory muscles, no crackles or wheezing on normal respiration, no wheeze on forced expiration.  She does cough with a deep breath  Cardiovascular: RRR, heart sounds normal, no murmur or gallops, no peripheral edema  Musculoskeletal: No deformities, no cyanosis or clubbing  Neuro: alert, awake, non focal  Skin: Warm, no lesions or rash      Assessment & Plan:   Chronic cough Her pulmonary function testing is reassuring without any evidence of obstructive lung disease, normal flow volume loop.  She does still have cough, is unsure whether she benefited from the PPI.  She does  note some relationship to food, does have some symptoms at night.  She is willing to go back on a PPI and also try to more aggressively treat rhinitis.  I will add fluticasone nasal spray to her loratadine.  I think she needs an airway inspection to ensure no upper airway anatomical findings, obtain culture data given the mild bronchiectasis noted on her CT scan of the chest from 10/2022.  I will hold off on repeating the CT for now.  We will send BAL for culture data.  Her sputum AFB was negative.    Levy Pupa, MD, PhD 04/13/2023, 2:43 PM  [facility] [phone] or if no answer before 7:00PM call [phone] For any issues after 7:00PM please call eLink [phone]

## 2023-04-13 NOTE — Assessment & Plan Note (Signed)
Her pulmonary function testing is reassuring without any evidence of obstructive lung disease, normal flow volume loop.  She does still have cough, is unsure whether she benefited from the PPI.  She does note some relationship to food, does have some symptoms at night.  She is willing to go back on a PPI and also try to more aggressively treat rhinitis.  I will add fluticasone nasal spray to her loratadine.  I think she needs an airway inspection to ensure no upper airway anatomical findings, obtain culture data given the mild bronchiectasis noted on her CT scan of the chest from 10/2022.  I will hold off on repeating the CT for now.  We will send BAL for culture data.  Her sputum AFB was negative.

## 2023-04-13 NOTE — Patient Instructions (Signed)
Try going back on your pantoprazole 40 mg once daily.  Take this medication 1 hour around food. Continue loratadine 10 mg once daily Add fluticasone nasal spray, 2 sprays each nostril once daily Okay to use pseudoephedrine as needed for congestion, drainage We reviewed your pulmonary function testing today We will arrange for bronchoscopy to evaluate cough, obtain culture information Follow Dr. Delton Coombes in 2 months or next available

## 2023-04-13 NOTE — Progress Notes (Signed)
Subjective:    Patient ID: SHELANA COCKCROFT, female    DOB: 1950/11/01, 72 y.o.   MRN: [id number]  HPI 72 year old never smoker with a history of left breast cancer (mastectomy,no XRT), carpal tunnel, osteoarthritis, allergic rhinitis, hyperlipidemia. She has been experiencing chronic cough. She reports that she was well, began to notice cough in January, no sputum. CXR at that time was reassuring. She was treated w abx. Trial of PPI x 2 weeks > no change in the cough. Believes she was also treated w steroids. Has used DM, guaifenesin. Using some hydrocodone qhs. The cough was bothering her at night. She may now be producing some clear mucous. No real congestion or allergies. She had a hip replacement on 11/01/22.   CT chest 11/24/2022 reviewed by me shows no mediastinal or hilar adenopathy.  There is some mild tree-in-bud opacity in the inferior right upper lobe, right middle lobe and lower lobes with some focal bronchiectasis in the right upper lobe, right middle lobe, lingula.    ROV 04/13/2023 --72 year old woman with a history of left breast cancer whom I have seen for bronchiectasis, tree-in-bud infiltrates and chronic cough.  We obtain sputum cultures 12/23/22 that were all negative for AFB.  She was on loratadine and I tried adding PPI to see if she would get any benefit - she is unsure whether it helped. Sometimes her cough does seem to associate with meals.  Pulmonary function testing was performed as below  Pulm function testing 04/12/2023 reviewed by me shows normal airflows with no bronchodilator response.  Her lung volumes are normal.  Diffusion capacity normal.  Flow volume loop is normal in appearance.  Review of Systems As per HPI  Past Medical History:  Diagnosis Date   Arthritis    "right hip; hands" (07/01/2017)   Cancer of left breast (HCC) 08/2003   S/p left breast mastectomy   Carpal tunnel syndrome, bilateral    PONV (postoperative nausea and vomiting)      Family History   Problem Relation Age of Onset   Alzheimer's disease Mother    Seizures Mother      Social History   Socioeconomic History   Marital status: Married    Spouse name: Dorinda Hill   Number of children: 2   Years of education: Not on file   Highest education level: Not on file  Occupational History   Not on file  Tobacco Use   Smoking status: Never   Smokeless tobacco: Never  Vaping Use   Vaping status: Never Used  Substance and Sexual Activity   Alcohol use: Yes    Alcohol/week: 6.0 standard drinks of alcohol    Types: 6 Glasses of wine per week   Drug use: No   Sexual activity: Yes    Birth control/protection: Post-menopausal  Other Topics Concern   Not on file  Social History Narrative   Not on file   Social Determinants of Health   Financial Resource Strain: Not on file  Food Insecurity: No Food Insecurity (11/01/2022)   Hunger Vital Sign    Worried About Running Out of Food in the Last Year: Never true    Ran Out of Food in the Last Year: Never true  Transportation Needs: No Transportation Needs (11/01/2022)   PRAPARE - Administrator, Civil Service (Medical): No    Lack of Transportation (Non-Medical): No  Physical Activity: Not on file  Stress: Not on file  Social Connections: Not on file  Intimate Partner  Violence: Not At Risk (11/01/2022)   Humiliation, Afraid, Rape, and Kick questionnaire    Fear of Current or Ex-Partner: No    Emotionally Abused: No    Physically Abused: No    Sexually Abused: No     No Known Allergies   Outpatient Medications Prior to Visit  Medication Sig Dispense Refill   Calcium Carb-Cholecalciferol (CALCIUM 600+D) 600-10 MG-MCG TABS Take 1 tablet by mouth in the morning and at bedtime.     Cholecalciferol (VITAMIN D) 2000 UNITS tablet Take 2,000 Units by mouth daily.     Dextromethorphan-Menthol (DELSYM COUGH RELIEF MT) Use as directed in the mouth or throat.     HYDROcodone bit-homatropine (HYDROMET) 5-1.5 MG/5ML syrup Take 5  mLs by mouth every 6 (six) hours as needed. 200 mL 0   loratadine (CLARITIN) 10 MG tablet Take 10 mg by mouth daily.     Multiple Vitamins-Minerals (MULTIVITAMIN WITH MINERALS) tablet Take 1 tablet by mouth daily.     rosuvastatin (CRESTOR) 10 MG tablet Take 1 tablet (10 mg total) by mouth daily. 90 tablet 3   dextromethorphan (DELSYM) 30 MG/5ML liquid      HYDROcodone bit-homatropine (HYDROMET) 5-1.5 MG/5ML syrup Take 5 mL as needed Orally every 6 hrs for 10 days 200 mL 0   HYDROcodone bit-homatropine (HYDROMET) 5-1.5 MG/5ML syrup Take 5 mLs by mouth every 6 (six) hours as needed for 10 days. 200 mL 0   HYDROcodone bit-homatropine (HYDROMET) 5-1.5 MG/5ML syrup Take 5 mLs by mouth every 6 (six) hours as needed for 10 days. 200 mL 0   pantoprazole (PROTONIX) 40 MG tablet Take 1 tablet twice daily for 2 weeks, then 1 tablet daily for 2 weeks and then stop. 42 tablet 0   No facility-administered medications prior to visit.        Objective:   Physical Exam  Vitals:   04/13/23 1352  BP: 122/62  Pulse: 78  Resp: 14  SpO2: 96%  Weight: 189 lb 9.6 oz (86 kg)  Height: 5\' 7"  (1.702 m)   Gen: Pleasant, well-nourished, in no distress,  normal affect, frequent throat clearing and occasional cough  ENT: No lesions,  mouth clear,  oropharynx clear, no postnasal drip  Neck: No JVD, no stridor  Lungs: No use of accessory muscles, no crackles or wheezing on normal respiration, no wheeze on forced expiration.  She does cough with a deep breath  Cardiovascular: RRR, heart sounds normal, no murmur or gallops, no peripheral edema  Musculoskeletal: No deformities, no cyanosis or clubbing  Neuro: alert, awake, non focal  Skin: Warm, no lesions or rash      Assessment & Plan:   Chronic cough Her pulmonary function testing is reassuring without any evidence of obstructive lung disease, normal flow volume loop.  She does still have cough, is unsure whether she benefited from the PPI.  She does  note some relationship to food, does have some symptoms at night.  She is willing to go back on a PPI and also try to more aggressively treat rhinitis.  I will add fluticasone nasal spray to her loratadine.  I think she needs an airway inspection to ensure no upper airway anatomical findings, obtain culture data given the mild bronchiectasis noted on her CT scan of the chest from 10/2022.  I will hold off on repeating the CT for now.  We will send BAL for culture data.  Her sputum AFB was negative.    Levy Pupa, MD, PhD 04/13/2023, 2:43 PM  [facility] [phone] or if no answer before 7:00PM call [phone] For any issues after 7:00PM please call eLink [phone]

## 2023-04-27 ENCOUNTER — Telehealth: Payer: Self-pay

## 2023-04-27 ENCOUNTER — Encounter: Payer: Self-pay | Admitting: Orthopaedic Surgery

## 2023-04-27 ENCOUNTER — Ambulatory Visit: Payer: PPO | Admitting: Orthopaedic Surgery

## 2023-04-27 ENCOUNTER — Other Ambulatory Visit (INDEPENDENT_AMBULATORY_CARE_PROVIDER_SITE_OTHER): Payer: PPO

## 2023-04-27 DIAGNOSIS — M1712 Unilateral primary osteoarthritis, left knee: Secondary | ICD-10-CM | POA: Diagnosis not present

## 2023-04-27 DIAGNOSIS — Z96642 Presence of left artificial hip joint: Secondary | ICD-10-CM

## 2023-04-27 NOTE — Progress Notes (Signed)
Office Visit Note   Patient: [name]           Date of Birth: 1950-12-24           MRN: [id number] Visit Date: 04/27/2023              Requested by: Creola Corn, MD [address] [place],  [place] [address] PCP: Creola Corn, MD   Assessment & Plan: Visit Diagnoses:  1. Status post total replacement of left hip   2. Primary osteoarthritis of left knee     Plan: In regards to the hip replacement she has done very well has no problems.  Dental prophylaxis reinforced.  For the knee arthritis we will get approval for viscosupplementation in the new year.  Follow-up in 6 months for both the knee and the left hip with repeat x-rays of the left hip.  Follow-Up Instructions: Return in about 6 months (around 10/28/2023).   Orders:  Orders Placed This Encounter  Procedures   XR Pelvis 1-2 Views   No orders of the defined types were placed in this encounter.     Procedures: No procedures performed   Clinical Data: No additional findings.   Subjective: Chief Complaint  Patient presents with   Left Hip - Follow-up    HPI Patient is 6 months status post left total hip replacement.  She has done well from the surgery and overall very pleased with the outcome.  She has resumed all normal activities.  Her left knee bothers her to a mild extent.  Reports pain 2 out of 10.  Underwent Visco injection in January of this year. Review of Systems   Objective: Vital Signs: There were no vitals taken for this visit.  Physical Exam  Ortho Exam Examination of the left knee is unchanged. Examination of the left hip shows a fully healed surgical scar.  Fluid painless range of motion.  Normal gait pattern. Specialty Comments:  No specialty comments available.  Imaging: XR Pelvis 1-2 Views  Result Date: 04/27/2023 Stable left total hip replacement without complications    PMFS History: Patient Active Problem List   Diagnosis Date Noted   Primary osteoarthritis of left knee  04/27/2023   Chronic cough 12/23/2022   Abnormal CT of the chest 12/23/2022   Status post total replacement of left hip 11/01/2022   Primary osteoarthritis of left hip 10/31/2022   Painful orthopaedic hardware (HCC) 09/28/2018   Closed displaced comminuted fracture of shaft of left tibia 07/01/2017   Bimalleolar ankle fracture, left, closed, initial encounter 07/01/2017   History of open reduction and internal fixation (ORIF) procedure 07/01/2017   History of breast cancer 05/24/2013   Past Medical History:  Diagnosis Date   Arthritis    "right hip; hands" (07/01/2017)   Cancer of left breast (HCC) 08/2003   S/p left breast mastectomy   Carpal tunnel syndrome, bilateral    PONV (postoperative nausea and vomiting)     Family History  Problem Relation Age of Onset   Alzheimer's disease Mother    Seizures Mother     Past Surgical History:  Procedure Laterality Date   BREAST BIOPSY Left 2005   BREAST BIOPSY Right 06/23/2021   CARPAL TUNNEL RELEASE Bilateral    COLONOSCOPY     FRACTURE SURGERY  07/01/2017   MASTECTOMY Left 10/14/2003   MASTECTOMY WITH AXILLARY LYMPH NODE DISSECTION Left 10/14/2003   OPEN REDUCTION INTERNAL FIXATION (ORIF) TIBIA/FIBULA FRACTURE Left 07/01/2017   OPEN REDUCTION INTERNAL FIXATION (ORIF) LEFT  TIBIA/PILON FRACTURE/FIBULA FRACTURE    ORIF ANKLE FRACTURE Left 07/01/2017   Procedure: OPEN REDUCTION INTERNAL FIXATION (ORIF) LEFT TIBIA/PILON FRACTURE/FIBULA FRACTURE;  Surgeon: Tarry Kos, MD;  Location: [facility] OR;  Service: Orthopedics;  Laterality: Left;   RECONSTRUCTION BREAST W/ TRAM FLAP Left 10/14/2003   TOTAL HIP ARTHROPLASTY Left 11/01/2022   Procedure: LEFT TOTAL HIP ARTHROPLASTY ANTERIOR APPROACH;  Surgeon: Tarry Kos, MD;  Location: [facility] OR;  Service: Orthopedics;  Laterality: Left;  3-C   Social History   Occupational History   Not on file  Tobacco Use   Smoking status: Never   Smokeless tobacco: Never  Vaping Use   Vaping status: Never  Used  Substance and Sexual Activity   Alcohol use: Yes    Alcohol/week: 6.0 standard drinks of alcohol    Types: 6 Glasses of wine per week   Drug use: No   Sexual activity: Yes    Birth control/protection: Post-menopausal

## 2023-04-27 NOTE — Telephone Encounter (Signed)
Please precert for left knee visco. Dr.Xu's patient. Thanks!  

## 2023-04-27 NOTE — Telephone Encounter (Signed)
Talked with patient to get clarification on gel injection.  Will submit for Monovisc, left knee January, 2025 per patient's request.

## 2023-04-28 ENCOUNTER — Other Ambulatory Visit (HOSPITAL_BASED_OUTPATIENT_CLINIC_OR_DEPARTMENT_OTHER): Payer: Self-pay

## 2023-04-28 MED ORDER — HYDROCODONE BIT-HOMATROP MBR 5-1.5 MG/5ML PO SOLN
5.0000 mL | Freq: Four times a day (QID) | ORAL | 0 refills | Status: DC | PRN
  Filled 2023-04-28: qty 200, 10d supply, fill #0

## 2023-04-29 ENCOUNTER — Other Ambulatory Visit: Payer: Self-pay

## 2023-05-05 ENCOUNTER — Other Ambulatory Visit: Payer: Self-pay | Admitting: Oncology

## 2023-05-05 DIAGNOSIS — Z006 Encounter for examination for normal comparison and control in clinical research program: Secondary | ICD-10-CM

## 2023-05-06 ENCOUNTER — Other Ambulatory Visit: Payer: Self-pay

## 2023-05-06 ENCOUNTER — Encounter (HOSPITAL_COMMUNITY): Payer: Self-pay | Admitting: Emergency Medicine

## 2023-05-06 ENCOUNTER — Telehealth: Payer: Self-pay | Admitting: Emergency Medicine

## 2023-05-06 NOTE — Telephone Encounter (Signed)
Not sure what orders need to be placed for pre-op for Bronch.  Please place or advise, thank you!

## 2023-05-06 NOTE — Telephone Encounter (Signed)
Pt sched Monday for sx. Pre-Admissin Testing Dept Tressie Ellis calling.  No orders in for Monday's sx. Please put Surgery orders in Epic.

## 2023-05-06 NOTE — Progress Notes (Addendum)
PCP - Creola Corn Cardiologist - denies  PPM/ICD - denies Device Orders - n/a Rep Notified - n/a  Chest x-ray - denies EKG - denies Stress Test - denies ECHO - denies Cardiac Cath - denies  CPAP - denies  DM denies  Blood Thinner Instructions: denies Aspirin Instructions: n/a  ERAS Protcol - ERAS no drink  COVID TEST- n/a  Anesthesia review: no   Patient verbally denies any shortness of breath, fever, cough and chest pain during phone call   -------------  SDW INSTRUCTIONS given:  Your procedure is scheduled on sept 9,2024.  Report to [facility] Main Entrance "A" at 6:00 A.M., and check in at the Admitting office.  Call this number if you have problems the morning of surgery:  [phone]   Remember:  Do not eat after midnight the night before your surgery  You may drink clear liquids until 5:30 A. M. the morning of your surgery.   Clear liquids allowed are: Water, Non-Citrus Juices (without pulp), Carbonated Beverages, Clear Tea, Black Coffee Only, and Gatorade     Take these medicines the morning of surgery with A SIP OF WATER  fluticasone (FLONASE)  loratadine (CLARITIN)  pantoprazole (PROTONIX)  rosuvastatin (CRESTOR)   IF NEEDED:  HYDROcodone bit-homatropine (HYDROMET)    As of today, STOP taking any Aspirin (unless otherwise instructed by your surgeon) Aleve, Naproxen, Ibuprofen, Motrin, Advil, Goody's, BC's, all herbal medications, fish oil, and all vitamins.                      Do not wear jewelry, make up, or nail polish            Do not wear lotions, powders, perfumes/colognes, or deodorant.            Do not shave 48 hours prior to surgery.  Men may shave face and neck.            Do not bring valuables to the hospital.            [facility] is not responsible for any belongings or valuables.  Do NOT Smoke (Tobacco/Vaping) 24 hours prior to your procedure If you use a CPAP at night, you may bring all equipment for your overnight stay.    Contacts, glasses, dentures or bridgework may not be worn into surgery.      For patients admitted to the hospital, discharge time will be determined by your treatment team.   Patients discharged the day of surgery will not be allowed to drive home, and someone needs to stay with them for 24 hours.    Special instructions:   [facility]- Preparing For Surgery  Before surgery, you can play an important role. Because skin is not sterile, your skin needs to be as free of germs as possible. You can reduce the number of germs on your skin by washing with CHG (chlorahexidine gluconate) Soap before surgery.  CHG is an antiseptic cleaner which kills germs and bonds with the skin to continue killing germs even after washing.    Oral Hygiene is also important to reduce your risk of infection.  Remember - BRUSH YOUR TEETH THE MORNING OF SURGERY WITH YOUR REGULAR TOOTHPASTE  Please do not use if you have an allergy to CHG or antibacterial soaps. If your skin becomes reddened/irritated stop using the CHG.  Do not shave (including legs and underarms) for at least 48 hours prior to first CHG shower. It is OK to shave  your face.  Please follow these instructions carefully.   Shower the NIGHT BEFORE SURGERY and the MORNING OF SURGERY with DIAL Soap.   Pat yourself dry with a CLEAN TOWEL.  Wear CLEAN PAJAMAS to bed the night before surgery  Place CLEAN SHEETS on your bed the night of your first shower and DO NOT SLEEP WITH PETS.   Day of Surgery: Please shower morning of surgery  Wear Clean/Comfortable clothing the morning of surgery Do not apply any deodorants/lotions.   Remember to brush your teeth WITH YOUR REGULAR TOOTHPASTE.   Questions were answered. Patient verbalized understanding of instructions.

## 2023-05-09 ENCOUNTER — Telehealth: Payer: Self-pay | Admitting: Emergency Medicine

## 2023-05-09 ENCOUNTER — Encounter (HOSPITAL_COMMUNITY): Payer: Self-pay | Admitting: Emergency Medicine

## 2023-05-09 ENCOUNTER — Ambulatory Visit (HOSPITAL_COMMUNITY): Payer: Self-pay | Admitting: Anesthesiology

## 2023-05-09 ENCOUNTER — Encounter (HOSPITAL_COMMUNITY)
Admission: RE | Disposition: A | Discharge status: Home / Self Care | Payer: Self-pay | Source: Home / Self Care | Attending: Emergency Medicine

## 2023-05-09 ENCOUNTER — Ambulatory Visit (HOSPITAL_BASED_OUTPATIENT_CLINIC_OR_DEPARTMENT_OTHER): Payer: PPO | Admitting: Anesthesiology

## 2023-05-09 ENCOUNTER — Other Ambulatory Visit: Payer: Self-pay

## 2023-05-09 ENCOUNTER — Ambulatory Visit (HOSPITAL_COMMUNITY)
Admission: RE | Admit: 2023-05-09 | Discharge: 2023-05-09 | Disposition: A | Discharge status: Home / Self Care | Payer: PPO | Attending: Emergency Medicine | Admitting: Emergency Medicine

## 2023-05-09 DIAGNOSIS — Z853 Personal history of malignant neoplasm of breast: Secondary | ICD-10-CM | POA: Insufficient documentation

## 2023-05-09 DIAGNOSIS — R053 Chronic cough: Secondary | ICD-10-CM | POA: Diagnosis not present

## 2023-05-09 DIAGNOSIS — E785 Hyperlipidemia, unspecified: Secondary | ICD-10-CM | POA: Insufficient documentation

## 2023-05-09 DIAGNOSIS — Z96649 Presence of unspecified artificial hip joint: Secondary | ICD-10-CM | POA: Insufficient documentation

## 2023-05-09 DIAGNOSIS — R918 Other nonspecific abnormal finding of lung field: Secondary | ICD-10-CM | POA: Diagnosis not present

## 2023-05-09 DIAGNOSIS — J040 Acute laryngitis: Secondary | ICD-10-CM | POA: Insufficient documentation

## 2023-05-09 DIAGNOSIS — K219 Gastro-esophageal reflux disease without esophagitis: Secondary | ICD-10-CM | POA: Insufficient documentation

## 2023-05-09 DIAGNOSIS — R9389 Abnormal findings on diagnostic imaging of other specified body structures: Secondary | ICD-10-CM

## 2023-05-09 DIAGNOSIS — J309 Allergic rhinitis, unspecified: Secondary | ICD-10-CM | POA: Diagnosis not present

## 2023-05-09 HISTORY — PX: BRONCHIAL WASHINGS: SHX5105

## 2023-05-09 HISTORY — PX: VIDEO BRONCHOSCOPY: SHX5072

## 2023-05-09 LAB — CBC
HCT: 37.9 % (ref 36.0–46.0)
Hemoglobin: 12 g/dL (ref 12.0–15.0)
MCH: 29.9 pg (ref 26.0–34.0)
MCHC: 31.7 g/dL (ref 30.0–36.0)
MCV: 94.5 fL (ref 80.0–100.0)
Platelets: 295 10*3/uL (ref 150–400)
RBC: 4.01 MIL/uL (ref 3.87–5.11)
RDW: 13.1 % (ref 11.5–15.5)
WBC: 7.6 10*3/uL (ref 4.0–10.5)
nRBC: 0 % (ref 0.0–0.2)

## 2023-05-09 LAB — BODY FLUID CELL COUNT WITH DIFFERENTIAL
Eos, Fluid: 0 %
Lymphs, Fluid: 4 %
Monocyte-Macrophage-Serous Fluid: 3 % — ABNORMAL LOW (ref 50–90)
Neutrophil Count, Fluid: 93 % — ABNORMAL HIGH (ref 0–25)
Total Nucleated Cell Count, Fluid: 1950 uL — ABNORMAL HIGH (ref 0–1000)

## 2023-05-09 SURGERY — VIDEO BRONCHOSCOPY WITHOUT FLUORO
Anesthesia: General | Laterality: Bilateral

## 2023-05-09 MED ORDER — CHLORHEXIDINE GLUCONATE 0.12 % MT SOLN: 15.0000 mL | Freq: Once | OROMUCOSAL | Status: AC

## 2023-05-09 MED ORDER — SUCCINYLCHOLINE CHLORIDE 200 MG/10ML IV SOSY
PREFILLED_SYRINGE | INTRAVENOUS | Status: DC | PRN
  Administered 2023-05-09: 100 mg via INTRAVENOUS

## 2023-05-09 MED ORDER — LIDOCAINE 2% (20 MG/ML) 5 ML SYRINGE
INTRAMUSCULAR | Status: DC | PRN
  Administered 2023-05-09: 100 mg via INTRAVENOUS
  Administered 2023-05-09: 75 mg via INTRAVENOUS

## 2023-05-09 MED ORDER — CHLORHEXIDINE GLUCONATE 0.12 % MT SOLN
OROMUCOSAL | Status: AC
  Administered 2023-05-09: 15 mL via OROMUCOSAL
  Filled 2023-05-09: qty 15

## 2023-05-09 MED ORDER — AMISULPRIDE (ANTIEMETIC) 5 MG/2ML IV SOLN: 10.0000 mg | Freq: Once | INTRAVENOUS | Status: DC | PRN

## 2023-05-09 MED ORDER — ONDANSETRON HCL 4 MG/2ML IJ SOLN: 4.0000 mg | Freq: Once | INTRAMUSCULAR | Status: DC | PRN

## 2023-05-09 MED ORDER — FENTANYL CITRATE (PF) 250 MCG/5ML IJ SOLN
INTRAMUSCULAR | Status: DC | PRN
  Administered 2023-05-09 (×2): 50 ug via INTRAVENOUS

## 2023-05-09 MED ORDER — ACETAMINOPHEN 10 MG/ML IV SOLN: 1000.0000 mg | Freq: Once | INTRAVENOUS | Status: DC | PRN

## 2023-05-09 MED ORDER — DEXAMETHASONE SODIUM PHOSPHATE 10 MG/ML IJ SOLN
INTRAMUSCULAR | Status: DC | PRN
  Administered 2023-05-09: 10 mg via INTRAVENOUS

## 2023-05-09 MED ORDER — MIDAZOLAM HCL 2 MG/2ML IJ SOLN
INTRAMUSCULAR | Status: DC | PRN
  Administered 2023-05-09: 2 mg via INTRAVENOUS

## 2023-05-09 MED ORDER — LACTATED RINGERS IV SOLN: INTRAVENOUS | Status: DC

## 2023-05-09 MED ORDER — FENTANYL CITRATE (PF) 100 MCG/2ML IJ SOLN: 25.0000 ug | INTRAMUSCULAR | Status: DC | PRN

## 2023-05-09 MED ORDER — ONDANSETRON HCL 4 MG/2ML IJ SOLN
INTRAMUSCULAR | Status: DC | PRN
  Administered 2023-05-09: 4 mg via INTRAVENOUS

## 2023-05-09 NOTE — Telephone Encounter (Signed)
We can cancel this patient's follow-up visit with TP on 9/18.  I will call her with any relevant results.  Thank you

## 2023-05-09 NOTE — Op Note (Signed)
[facility] Cardiopulmonary Patient Name: [name] Date: 05/09/2023 MRN: [id number] Attending MD: Leslye Peer , MD,  Date of Birth: 1951/05/31 CSN: Finalized Age: 72 Admit Type: Outpatient Gender: Female Procedure:             Bronchoscopy Indications:           Chronic cough with abnormal CT Providers:             Leslye Peer, MD, Stephens Shire RN, RN, Harrington Challenger,                         Technician, Priscella Mann, Technician Referring MD:           Medicines:             General Anesthesia Complications:         No immediate complications Estimated Blood Loss:  Estimated blood loss: none. Procedure:             Pre-Anesthesia Assessment:                        - A History and Physical has been performed. Patient                         meds and allergies have been reviewed. The risks and                         benefits of the procedure and the sedation options and                         risks were discussed with the patient. All questions                         were answered and informed consent was obtained.                         Patient identification and proposed procedure were                         verified prior to the procedure by the physician in                         the pre-procedure area. Mental Status Examination:                         normal. Airway Examination: normal oropharyngeal                         airway. Respiratory Examination: clear to                         auscultation. CV Examination: normal. ASA Grade                         Assessment: I - A normal healthy patient. After                         reviewing the risks and benefits, the patient was  deemed in satisfactory condition to undergo the                         procedure. The anesthesia plan was to use monitored                         anesthesia care (MAC). Immediately prior to                         administration of medications,  the patient was                         re-assessed for adequacy to receive sedatives. The                         heart rate, respiratory rate, oxygen saturations,                         blood pressure, adequacy of pulmonary ventilation, and                         response to care were monitored throughout the                         procedure. The physical status of the patient was                         re-assessed after the procedure.                        After obtaining informed consent, the bronchoscope was                         passed under direct vision. Throughout the procedure,                         the patient's blood pressure, pulse, and oxygen                         saturations were monitored continuously. the [id number]                         ([id number]) Olympus broncoscope was introduced through                         the mouth, via the endotracheal tube (the patient was                         intubated for the procedure) and advanced to the                         tracheobronchial tree. The procedure was accomplished                         without difficulty. The patient tolerated the                         procedure well. Scope In: Scope Out: Findings:      Diffuse cobblestone change and  edema without airway obstruction. The       tonsils are large but normal. The vocal cords appear normal. The       subglottic space is normal. The trachea is collapsible consistent with       tracheobronchomalacia. There are tan secretions bilaterally that are       easily suctioned. The carina is sharp. The tracheobronchial tree was       examined to at least the first subsegmental level. Bronchial mucosa and       anatomy are normal; there are no endobronchial lesions. There are tan       secretions seen, suctioned.      Larynx: GERD findings were visualized including intra-arytenoid mucosal       thickening (posterior laryngitis). Hyperplastic changes were found        diffusely, throughout the larynx. The changes are not obstructing the       airway.      The bronchoscope was advanced until wedged at the desired location for       bronchoalveolar lavage. BAL was performed in the LUL anterior segment       (B3) of the lung and sent for cell count, bacterial culture, viral       smears & culture, and fungal & AFB analysis. 60 mL of fluid were       instilled. 27 mL were returned. The return was cloudy. There were no       mucoid plugs in the return fluid. Impression:            - Chronic cough with abnormal CT                        - The airway examination was normal.                        - Intra-arytenoid mucosal thickening (posterior                         laryngitis) suspected to be secondary to                         gastroesophageal reflux disease (GERD) was found.                        - Hyperplastic changes were seen diffusely, throughout                         the larynx.                        - No specimens collected. Moderate Sedation:      Performed under general anesthesia Recommendation:        - Await BAL and culture results. Procedure Code(s):     --- Professional ---                        [phone], Bronchoscopy, rigid or flexible, including                         fluoroscopic guidance, when performed; with bronchial                         alveolar lavage Diagnosis Code(s):     ---  Professional ---                        R05.3, Chronic cough                        R91.8, Other nonspecific abnormal finding of lung field CPT copyright 2022 American Medical Association. All rights reserved. The codes documented in this report are preliminary and upon coder review may  be revised to meet current compliance requirements. Leslye Peer, MD Leslye Peer, MD 05/09/2023 9:35:49 AM Number of Addenda: 0

## 2023-05-09 NOTE — Transfer of Care (Signed)
Immediate Anesthesia Transfer of Care Note  Patient: [name]  Procedure(s) Performed: VIDEO BRONCHOSCOPY WITHOUT FLUORO (Bilateral) BRONCHIAL WASHINGS  Patient Location: PACU  Anesthesia Type:General  Level of Consciousness: awake, alert , and oriented  Airway & Oxygen Therapy: Patient Spontanous Breathing  Post-op Assessment: Report given to RN and Post -op Vital signs reviewed and stable  Post vital signs: Reviewed and stable  Last Vitals:  Vitals Value Taken Time  BP 155/80 05/09/23 0933  Temp 36.5 C 05/09/23 0933  Pulse 81 05/09/23 0937  Resp 18 05/09/23 0937  SpO2 95 % 05/09/23 0937  Vitals shown include unfiled device data.  Last Pain:  Vitals:   05/09/23 0933  TempSrc:   PainSc: 0-No pain         Complications: No notable events documented.

## 2023-05-09 NOTE — Discharge Instructions (Signed)
Flexible Bronchoscopy, Care After This sheet gives you information about how to care for yourself after your test. Your doctor may also give you more specific instructions. If you have problems or questions, contact your doctor. Follow these instructions at home: Eating and drinking When your numbness is gone and your cough and gag reflexes have come back, you may: Eat only soft foods. Slowly drink liquids. The day after the test, go back to your normal diet. Driving Do not drive for 24 hours if you were given a medicine to help you relax (sedative). Do not drive or use heavy machinery while taking prescription pain medicine. General instructions  Take over-the-counter and prescription medicines only as told by your doctor. Return to your normal activities as told. Ask what activities are safe for you. Do not use any products that have nicotine or tobacco in them. This includes cigarettes and e-cigarettes. If you need help quitting, ask your doctor. Keep all follow-up visits as told by your doctor. This is important. It is very important if you had a tissue sample (biopsy) taken. Get help right away if: You have shortness of breath that gets worse. You get light-headed. You feel like you are going to pass out (faint). You have chest pain. You cough up: More than a little blood. More blood than before. Summary Do not eat or drink anything (not even water) for 2 hours after your test, or until your numbing medicine wears off. Do not use cigarettes. Do not use e-cigarettes. Get help right away if you have chest pain.  Please call our office for any questions or concerns.  [phone].  This information is not intended to replace advice given to you by your health care provider. Make sure you discuss any questions you have with your health care provider. Document Released: 06/13/2009 Document Revised: 07/29/2017 Document Reviewed: 09/03/2016 Elsevier Patient Education  2020 [employer]  [employer]  

## 2023-05-09 NOTE — Anesthesia Preprocedure Evaluation (Addendum)
Anesthesia Evaluation  Patient identified by MRN, date of birth, ID band Patient awake    Reviewed: Allergy & Precautions, NPO status , Patient's Chart, lab work & pertinent test results  History of Anesthesia Complications (+) PONV and history of anesthetic complications  Airway Mallampati: I  TM Distance: >3 FB Neck ROM: Full    Dental no notable dental hx.    Pulmonary    Pulmonary exam normal        Cardiovascular negative cardio ROS Normal cardiovascular exam     Neuro/Psych  Neuromuscular disease  negative psych ROS   GI/Hepatic negative GI ROS,,,(+)     substance abuse    Endo/Other  negative endocrine ROS    Renal/GU negative Renal ROS     Musculoskeletal  (+) Arthritis ,    Abdominal   Peds  Hematology negative hematology ROS (+)   Anesthesia Other Findings BILATERAL NODULE CHRONIC COUGH  Reproductive/Obstetrics                             Anesthesia Physical Anesthesia Plan  ASA: 2  Anesthesia Plan: MAC   Post-op Pain Management:    Induction: Intravenous  PONV Risk Score and Plan: 3 and Ondansetron, Dexamethasone, Propofol infusion, Midazolam and Treatment may vary due to age or medical condition  Airway Management Planned: Nasal Cannula  Additional Equipment:   Intra-op Plan:   Post-operative Plan:   Informed Consent: I have reviewed the patients History and Physical, chart, labs and discussed the procedure including the risks, benefits and alternatives for the proposed anesthesia with the patient or authorized representative who has indicated his/her understanding and acceptance.     Dental advisory given  Plan Discussed with: CRNA  Anesthesia Plan Comments:        Anesthesia Quick Evaluation

## 2023-05-09 NOTE — Interval H&P Note (Signed)
History and Physical Interval Note:  05/09/2023 7:43 AM  [name]  has presented today for surgery, with the diagnosis of BILATERAL NODULE AND CHRONIC COUGH.  The various methods of treatment have been discussed with the patient and family. After consideration of risks, benefits and other options for treatment, the patient has consented to  Procedure(s): VIDEO BRONCHOSCOPY WITHOUT FLUORO (Bilateral) as a surgical intervention.  The patient's history has been reviewed, patient examined, no change in status, stable for surgery.  I have reviewed the patient's chart and labs.  Questions were answered to the patient's satisfaction.     Leslye Peer

## 2023-05-09 NOTE — Anesthesia Procedure Notes (Signed)
Procedure Name: Intubation Date/Time: 05/09/2023 9:10 AM  Performed by: Gwenyth Allegra, CRNAPre-anesthesia Checklist: Patient identified, Emergency Drugs available, Suction available, Patient being monitored and Timeout performed Patient Re-evaluated:Patient Re-evaluated prior to induction Oxygen Delivery Method: Circle system utilized Preoxygenation: Pre-oxygenation with 100% oxygen Induction Type: IV induction Ventilation: Mask ventilation without difficulty Laryngoscope Size: Mac and 3 Grade View: Grade II Tube type: Oral Tube size: 7.5 mm Number of attempts: 1 Airway Equipment and Method: Stylet Secured at: 22 cm Tube secured with: Tape Dental Injury: Teeth and Oropharynx as per pre-operative assessment

## 2023-05-10 NOTE — Telephone Encounter (Signed)
Appointment cancelled

## 2023-05-10 NOTE — Anesthesia Postprocedure Evaluation (Signed)
Anesthesia Post Note  Patient: OTILLIE LOMELI  Procedure(s) Performed: VIDEO BRONCHOSCOPY WITHOUT FLUORO (Bilateral) BRONCHIAL WASHINGS     Patient location during evaluation: PACU Anesthesia Type: General Level of consciousness: awake Pain management: pain level controlled Vital Signs Assessment: post-procedure vital signs reviewed and stable Respiratory status: spontaneous breathing, nonlabored ventilation and respiratory function stable Cardiovascular status: blood pressure returned to baseline and stable Postop Assessment: no apparent nausea or vomiting Anesthetic complications: no   No notable events documented.  Last Vitals:  Vitals:   05/09/23 0955 05/09/23 1000  BP:  136/71  Pulse: 69 66  Resp: 18 19  Temp:  36.6 C  SpO2: 94% 95%    Last Pain:  Vitals:   05/09/23 1000  TempSrc:   PainSc: 0-No pain                 Tylik Treese P Kaysha Parsell

## 2023-05-11 LAB — ACID FAST SMEAR (AFB, MYCOBACTERIA)
Acid Fast Smear: NEGATIVE
Acid Fast Smear: NEGATIVE

## 2023-05-11 LAB — CULTURE, BAL-QUANTITATIVE W GRAM STAIN: Culture: 80000 — AB

## 2023-05-11 LAB — CULTURE, RESPIRATORY W GRAM STAIN: Special Requests: NORMAL

## 2023-05-13 LAB — AEROBIC/ANAEROBIC CULTURE W GRAM STAIN (SURGICAL/DEEP WOUND)

## 2023-05-15 ENCOUNTER — Encounter (HOSPITAL_COMMUNITY): Payer: Self-pay | Admitting: Emergency Medicine

## 2023-05-17 ENCOUNTER — Other Ambulatory Visit (HOSPITAL_BASED_OUTPATIENT_CLINIC_OR_DEPARTMENT_OTHER): Payer: Self-pay

## 2023-05-18 ENCOUNTER — Inpatient Hospital Stay: Payer: PPO | Admitting: Adult Health

## 2023-05-18 LAB — FUNGUS CULTURE WITH STAIN

## 2023-05-18 LAB — FUNGUS CULTURE RESULT

## 2023-05-24 ENCOUNTER — Other Ambulatory Visit (HOSPITAL_COMMUNITY): Payer: Self-pay

## 2023-05-24 ENCOUNTER — Other Ambulatory Visit: Payer: Self-pay

## 2023-05-25 ENCOUNTER — Other Ambulatory Visit (HOSPITAL_BASED_OUTPATIENT_CLINIC_OR_DEPARTMENT_OTHER): Payer: Self-pay

## 2023-05-25 MED ORDER — HYDROCODONE BIT-HOMATROP MBR 5-1.5 MG/5ML PO SOLN
5.0000 mL | Freq: Four times a day (QID) | ORAL | 0 refills | Status: DC | PRN
  Filled 2023-05-25: qty 200, 10d supply, fill #0

## 2023-05-26 ENCOUNTER — Other Ambulatory Visit (HOSPITAL_BASED_OUTPATIENT_CLINIC_OR_DEPARTMENT_OTHER): Payer: Self-pay

## 2023-05-26 ENCOUNTER — Other Ambulatory Visit (HOSPITAL_COMMUNITY): Payer: Self-pay

## 2023-06-15 ENCOUNTER — Other Ambulatory Visit (HOSPITAL_BASED_OUTPATIENT_CLINIC_OR_DEPARTMENT_OTHER): Payer: Self-pay

## 2023-06-15 ENCOUNTER — Encounter: Payer: Self-pay | Admitting: Emergency Medicine

## 2023-06-15 ENCOUNTER — Ambulatory Visit: Payer: PPO | Admitting: Emergency Medicine

## 2023-06-15 VITALS — BP 160/89 | HR 68 | Temp 98.5°F | Ht 67.0 in | Wt 168.8 lb

## 2023-06-15 DIAGNOSIS — R053 Chronic cough: Secondary | ICD-10-CM | POA: Diagnosis not present

## 2023-06-15 DIAGNOSIS — Z23 Encounter for immunization: Secondary | ICD-10-CM

## 2023-06-15 MED ORDER — AMOXICILLIN-POT CLAVULANATE 875-125 MG PO TABS
1.0000 | ORAL_TABLET | Freq: Two times a day (BID) | ORAL | 0 refills | Status: DC
Start: 2023-06-15 — End: 2024-02-02
  Filled 2023-06-15: qty 14, 7d supply, fill #0

## 2023-06-15 NOTE — Patient Instructions (Addendum)
Please take Augmentin 875 mg twice a day for 7 days Take an over-the-counter probiotic as directed while you are on the antibiotic We will follow your mycobacterial cultures to completion Continue your Protonix 40 mg twice a day. We will refer you to see Dr. Marina Goodell with Gastroenterology to evaluate for breakthrough reflux and its contribution to cough Follow with Dr. Delton Coombes in 2 months or sooner if you have any problems.

## 2023-06-15 NOTE — Progress Notes (Signed)
Subjective:    Patient ID: [name], female    DOB: Sep 28, 1950, 72 y.o.   MRN: [id number]  HPI  ROV 04/13/2023 --72 year old woman with a history of left breast cancer whom I have seen for bronchiectasis, tree-in-bud infiltrates and chronic cough.  We obtain sputum cultures 12/23/22 that were all negative for AFB.  She was on loratadine and I tried adding PPI to see if she would get any benefit - she is unsure whether it helped. Sometimes her cough does seem to associate with meals.  Pulmonary function testing was performed as below  Pulm function testing 04/12/2023 reviewed by me shows normal airflows with no bronchodilator response.  Her lung volumes are normal.  Diffusion capacity normal.  Flow volume loop is normal in appearance.  ROV 06/15/2023 --follow-up visit for 72 year old woman with history of left breast cancer and bronchiectasis with tree-in-bud infiltrates on CT.  She has chronic cough.  We performed bronchoscopy on 05/09/2023 that showed diffuse cobblestone changes and edema in the posterior pharynx without any evidence of airway obstruction the tonsils were large.  The vocal cords were normal the subglottic space was normal.  The trachea was collapsible consistent with tracheobronchomalacia with tan secretions bilaterally that were easily suctioned there were no endobronchial lesions.  Cell count showed WBC 1950 that was neutrophil predominant.  Bacterial cultures positive for haemophilus influenza, fungal cultures are negative, AFB smear negative with final culture pending. She has been on protonix bid which may have helped her some.    Review of Systems As per HPI  Past Medical History:  Diagnosis Date   Arthritis    "right hip; hands" (07/01/2017)   Cancer of left breast (HCC) 08/2003   S/p left breast mastectomy   Carpal tunnel syndrome, bilateral    PONV (postoperative nausea and vomiting)      Family History  Problem Relation Age of Onset   Alzheimer's disease Mother     Seizures Mother      Social History   Socioeconomic History   Marital status: Married    Spouse name: Dorinda Hill   Number of children: 2   Years of education: Not on file   Highest education level: Not on file  Occupational History   Not on file  Tobacco Use   Smoking status: Never   Smokeless tobacco: Never  Vaping Use   Vaping status: Never Used  Substance and Sexual Activity   Alcohol use: Yes    Alcohol/week: 6.0 standard drinks of alcohol    Types: 6 Glasses of wine per week    Comment: 5 times a week   Drug use: No   Sexual activity: Yes    Birth control/protection: Post-menopausal  Other Topics Concern   Not on file  Social History Narrative   Not on file   Social Determinants of Health   Financial Resource Strain: Not on file  Food Insecurity: No Food Insecurity (11/01/2022)   Hunger Vital Sign    Worried About Running Out of Food in the Last Year: Never true    Ran Out of Food in the Last Year: Never true  Transportation Needs: No Transportation Needs (11/01/2022)   PRAPARE - Administrator, Civil Service (Medical): No    Lack of Transportation (Non-Medical): No  Physical Activity: Not on file  Stress: Not on file  Social Connections: Not on file  Intimate Partner Violence: Not At Risk (11/01/2022)   Humiliation, Afraid, Rape, and Kick questionnaire  Fear of Current or Ex-Partner: No    Emotionally Abused: No    Physically Abused: No    Sexually Abused: No     No Known Allergies   Outpatient Medications Prior to Visit  Medication Sig Dispense Refill   aluminum-magnesium hydroxide 200-200 MG/5ML suspension Take by mouth every 6 (six) hours as needed for indigestion.     Calcium Carb-Cholecalciferol (CALCIUM 600+D) 600-10 MG-MCG TABS Take 1 tablet by mouth in the morning and at bedtime.     Cholecalciferol (VITAMIN D) 2000 UNITS tablet Take 2,000 Units by mouth daily.     Dextromethorphan-Menthol (DELSYM COUGH RELIEF MT) Use as directed in the  mouth or throat.     fluticasone (FLONASE) 50 MCG/ACT nasal spray Place 2 sprays into both nostrils daily. 16 g 2   HYDROcodone bit-homatropine (HYDROMET) 5-1.5 MG/5ML syrup Take 5 mLs by mouth every 6 (six) hours as needed 200 mL 0   loratadine (CLARITIN) 10 MG tablet Take 10 mg by mouth daily.     Multiple Vitamins-Minerals (MULTIVITAMIN WITH MINERALS) tablet Take 1 tablet by mouth daily.     pantoprazole (PROTONIX) 40 MG tablet Take 1 tablet (40 mg total) by mouth daily. 30 tablet 3   rosuvastatin (CRESTOR) 10 MG tablet Take 1 tablet (10 mg total) by mouth daily. 90 tablet 3   HYDROcodone bit-homatropine (HYDROMET) 5-1.5 MG/5ML syrup Take 5 mLs by mouth every 6 (six) hours as needed. 200 mL 0   HYDROcodone bit-homatropine (HYDROMET) 5-1.5 MG/5ML syrup Take 5 mLs by mouth every 6 (six) hours as needed as directed 200 mL 0   No facility-administered medications prior to visit.        Objective:   Physical Exam  Vitals:   06/15/23 1545 06/15/23 1553  BP: (!) 161/89 (!) 160/89  Pulse: 68 68  Temp: 98.5 F (36.9 C)   TempSrc: Oral   SpO2: 98% 98%  Weight: 168 lb 12.8 oz (76.6 kg)   Height: 5\' 7"  (1.702 m)    Gen: Pleasant, well-nourished, in no distress,  normal affect, frequent throat clearing and occasional cough  ENT: No lesions,  mouth clear,  oropharynx clear, no postnasal drip  Neck: No JVD, no stridor  Lungs: No use of accessory muscles, no crackles or wheezing on normal respiration, no wheeze on forced expiration.  She does cough with a deep breath  Cardiovascular: RRR, heart sounds normal, no murmur or gallops, no peripheral edema  Musculoskeletal: No deformities, no cyanosis or clubbing  Neuro: alert, awake, non focal  Skin: Warm, no lesions or rash      Assessment & Plan:   Chronic cough She has had some improvement with the addition of high-dose PPI but still coughing, still sometimes feels some GERD that is relieved by Mylanta.  Her bronchoscopy is fungal  culture negative, AFB final still pending but negative so far.  The bacterial culture showed H. influenzae with PMNs on the cell count.  Would be reasonable to treat her with Augmentin in case the H. influenzae is a contributor here.  She does not have significant rhinitis or congestion.  She is interested in seeing Dr. Marina Goodell with gastroenterology given the possible impact of breakthrough GERD even though she is on Protonix twice daily.  Please take Augmentin 875 mg twice a day for 7 days Take an over-the-counter probiotic as directed while you are on the antibiotic We will follow your mycobacterial cultures to completion Continue your Protonix 40 mg twice a day. We will refer  you to see Dr. Marina Goodell with Gastroenterology to evaluate for breakthrough reflux and its contribution to cough Follow with Dr. Delton Coombes in 2 months or sooner if you have any problems.    Time spent 30 minutes  Levy Pupa, MD, PhD 06/15/2023, 4:58 PM [facility] [phone] or if no answer before 7:00PM call [phone] For any issues after 7:00PM please call eLink [phone]

## 2023-06-15 NOTE — Assessment & Plan Note (Signed)
She has had some improvement with the addition of high-dose PPI but still coughing, still sometimes feels some GERD that is relieved by Mylanta.  Her bronchoscopy is fungal culture negative, AFB final still pending but negative so far.  The bacterial culture showed H. influenzae with PMNs on the cell count.  Would be reasonable to treat her with Augmentin in case the H. influenzae is a contributor here.  She does not have significant rhinitis or congestion.  She is interested in seeing Dr. Marina Goodell with gastroenterology given the possible impact of breakthrough GERD even though she is on Protonix twice daily.  Please take Augmentin 875 mg twice a day for 7 days Take an over-the-counter probiotic as directed while you are on the antibiotic We will follow your mycobacterial cultures to completion Continue your Protonix 40 mg twice a day. We will refer you to see Dr. Marina Goodell with Gastroenterology to evaluate for breakthrough reflux and its contribution to cough Follow with Dr. Delton Coombes in 2 months or sooner if you have any problems.

## 2023-06-22 ENCOUNTER — Other Ambulatory Visit (HOSPITAL_BASED_OUTPATIENT_CLINIC_OR_DEPARTMENT_OTHER): Payer: Self-pay

## 2023-06-22 MED ORDER — HYDROCODONE BIT-HOMATROP MBR 5-1.5 MG/5ML PO SOLN
5.0000 mL | Freq: Four times a day (QID) | ORAL | 0 refills | Status: AC | PRN
  Filled 2023-06-22: qty 200, 10d supply, fill #0

## 2023-06-23 DIAGNOSIS — L821 Other seborrheic keratosis: Secondary | ICD-10-CM | POA: Diagnosis not present

## 2023-06-23 DIAGNOSIS — L2989 Other pruritus: Secondary | ICD-10-CM | POA: Diagnosis not present

## 2023-06-23 DIAGNOSIS — L538 Other specified erythematous conditions: Secondary | ICD-10-CM | POA: Diagnosis not present

## 2023-06-23 DIAGNOSIS — L820 Inflamed seborrheic keratosis: Secondary | ICD-10-CM | POA: Diagnosis not present

## 2023-06-24 LAB — ACID FAST CULTURE WITH REFLEXED SENSITIVITIES (MYCOBACTERIA): Acid Fast Culture: NEGATIVE

## 2023-07-11 LAB — ACID FAST CULTURE WITH REFLEXED SENSITIVITIES (MYCOBACTERIA): Acid Fast Culture: NEGATIVE

## 2023-07-12 ENCOUNTER — Other Ambulatory Visit (HOSPITAL_COMMUNITY)
Admission: RE | Admit: 2023-07-12 | Discharge: 2023-07-12 | Disposition: A | Discharge status: Home / Self Care | Payer: PPO | Source: Ambulatory Visit | Attending: Oncology | Admitting: Oncology

## 2023-07-12 DIAGNOSIS — Z006 Encounter for examination for normal comparison and control in clinical research program: Secondary | ICD-10-CM | POA: Insufficient documentation

## 2023-07-15 ENCOUNTER — Other Ambulatory Visit (HOSPITAL_COMMUNITY): Payer: Self-pay

## 2023-07-15 ENCOUNTER — Other Ambulatory Visit: Payer: Self-pay

## 2023-07-15 DIAGNOSIS — E785 Hyperlipidemia, unspecified: Secondary | ICD-10-CM | POA: Diagnosis not present

## 2023-07-15 DIAGNOSIS — R7989 Other specified abnormal findings of blood chemistry: Secondary | ICD-10-CM | POA: Diagnosis not present

## 2023-07-15 DIAGNOSIS — Z1212 Encounter for screening for malignant neoplasm of rectum: Secondary | ICD-10-CM | POA: Diagnosis not present

## 2023-07-15 DIAGNOSIS — M8580 Other specified disorders of bone density and structure, unspecified site: Secondary | ICD-10-CM | POA: Diagnosis not present

## 2023-07-15 DIAGNOSIS — E559 Vitamin D deficiency, unspecified: Secondary | ICD-10-CM | POA: Diagnosis not present

## 2023-07-22 DIAGNOSIS — R82998 Other abnormal findings in urine: Secondary | ICD-10-CM | POA: Diagnosis not present

## 2023-07-22 DIAGNOSIS — R053 Chronic cough: Secondary | ICD-10-CM | POA: Diagnosis not present

## 2023-07-22 DIAGNOSIS — M1990 Unspecified osteoarthritis, unspecified site: Secondary | ICD-10-CM | POA: Diagnosis not present

## 2023-07-22 DIAGNOSIS — Z0000 Encounter for general adult medical examination without abnormal findings: Secondary | ICD-10-CM | POA: Diagnosis not present

## 2023-07-22 DIAGNOSIS — Z1212 Encounter for screening for malignant neoplasm of rectum: Secondary | ICD-10-CM | POA: Diagnosis not present

## 2023-07-22 DIAGNOSIS — E785 Hyperlipidemia, unspecified: Secondary | ICD-10-CM | POA: Diagnosis not present

## 2023-07-22 DIAGNOSIS — I700 Atherosclerosis of aorta: Secondary | ICD-10-CM | POA: Diagnosis not present

## 2023-07-22 DIAGNOSIS — Z1331 Encounter for screening for depression: Secondary | ICD-10-CM | POA: Diagnosis not present

## 2023-07-22 DIAGNOSIS — E559 Vitamin D deficiency, unspecified: Secondary | ICD-10-CM | POA: Diagnosis not present

## 2023-07-22 DIAGNOSIS — N951 Menopausal and female climacteric states: Secondary | ICD-10-CM | POA: Diagnosis not present

## 2023-07-22 DIAGNOSIS — M8580 Other specified disorders of bone density and structure, unspecified site: Secondary | ICD-10-CM | POA: Diagnosis not present

## 2023-07-22 DIAGNOSIS — Z1339 Encounter for screening examination for other mental health and behavioral disorders: Secondary | ICD-10-CM | POA: Diagnosis not present

## 2023-07-22 DIAGNOSIS — Z853 Personal history of malignant neoplasm of breast: Secondary | ICD-10-CM | POA: Diagnosis not present

## 2023-07-23 LAB — HELIX MOLECULAR SCREEN: Genetic Analysis Overall Interpretation: NEGATIVE

## 2023-07-23 LAB — GENECONNECT MOLECULAR SCREEN

## 2023-08-04 ENCOUNTER — Telehealth: Payer: Self-pay | Admitting: Emergency Medicine

## 2023-08-04 NOTE — Telephone Encounter (Signed)
Patient would like for Dr.Byrum to know that the antibiotic worked and that she no longer has a cough.

## 2023-08-10 ENCOUNTER — Ambulatory Visit: Payer: PPO | Admitting: Emergency Medicine

## 2023-08-11 DIAGNOSIS — L820 Inflamed seborrheic keratosis: Secondary | ICD-10-CM | POA: Diagnosis not present

## 2023-08-11 DIAGNOSIS — L538 Other specified erythematous conditions: Secondary | ICD-10-CM | POA: Diagnosis not present

## 2023-08-11 DIAGNOSIS — L2989 Other pruritus: Secondary | ICD-10-CM | POA: Diagnosis not present

## 2023-08-16 ENCOUNTER — Other Ambulatory Visit (HOSPITAL_COMMUNITY): Payer: Self-pay

## 2023-09-07 ENCOUNTER — Other Ambulatory Visit (HOSPITAL_BASED_OUTPATIENT_CLINIC_OR_DEPARTMENT_OTHER): Payer: Self-pay

## 2023-09-13 ENCOUNTER — Telehealth: Payer: Self-pay | Admitting: Orthopaedic Surgery

## 2023-09-13 NOTE — Telephone Encounter (Signed)
 Pt called requesting to submit to insurance for left knee gel injection. Please call pt when approved at [phone]. Already made appt November 03, 2023

## 2023-09-27 NOTE — Telephone Encounter (Signed)
VOB submitted for Monovisc, left knee

## 2023-09-29 ENCOUNTER — Other Ambulatory Visit (HOSPITAL_COMMUNITY): Payer: Self-pay

## 2023-10-04 ENCOUNTER — Other Ambulatory Visit (HOSPITAL_COMMUNITY): Payer: Self-pay

## 2023-10-04 MED ORDER — ROSUVASTATIN CALCIUM 10 MG PO TABS
10.0000 mg | ORAL_TABLET | Freq: Every day | ORAL | 3 refills | Status: AC
  Filled 2023-10-05: qty 90, 90d supply, fill #0
  Filled 2024-01-02: qty 90, 90d supply, fill #1
  Filled 2024-05-07: qty 90, 90d supply, fill #2
  Filled 2024-08-01: qty 90, 90d supply, fill #3

## 2023-10-05 ENCOUNTER — Other Ambulatory Visit: Payer: Self-pay

## 2023-10-05 ENCOUNTER — Other Ambulatory Visit (HOSPITAL_COMMUNITY): Payer: Self-pay

## 2023-10-06 ENCOUNTER — Other Ambulatory Visit (HOSPITAL_BASED_OUTPATIENT_CLINIC_OR_DEPARTMENT_OTHER): Payer: Self-pay

## 2023-10-06 MED ORDER — AMOXICILLIN-POT CLAVULANATE 875-125 MG PO TABS
1.0000 | ORAL_TABLET | Freq: Two times a day (BID) | ORAL | 0 refills | Status: AC
  Filled 2023-10-06: qty 20, 10d supply, fill #0

## 2023-10-13 ENCOUNTER — Other Ambulatory Visit: Payer: Self-pay

## 2023-10-13 ENCOUNTER — Encounter: Payer: Self-pay | Admitting: Orthopaedic Surgery

## 2023-10-13 ENCOUNTER — Telehealth: Payer: Self-pay

## 2023-10-13 DIAGNOSIS — M1712 Unilateral primary osteoarthritis, left knee: Secondary | ICD-10-CM

## 2023-10-13 NOTE — Telephone Encounter (Signed)
Talked with patient concerning approval for gel injection.

## 2023-10-20 DIAGNOSIS — L538 Other specified erythematous conditions: Secondary | ICD-10-CM | POA: Diagnosis not present

## 2023-10-20 DIAGNOSIS — L820 Inflamed seborrheic keratosis: Secondary | ICD-10-CM | POA: Diagnosis not present

## 2023-10-28 ENCOUNTER — Other Ambulatory Visit (HOSPITAL_BASED_OUTPATIENT_CLINIC_OR_DEPARTMENT_OTHER): Payer: Self-pay

## 2023-10-28 MED ORDER — AZITHROMYCIN 250 MG PO TABS
ORAL_TABLET | ORAL | 0 refills | Status: AC
  Filled 2023-10-28: qty 6, 5d supply, fill #0

## 2023-11-02 NOTE — Progress Notes (Unsigned)
   Procedure Note  Patient: [name]             Date of Birth: [birth year]           MRN: [id number]             Visit Date: 11/03/2023  Procedures: Visit Diagnoses:  1. Primary osteoarthritis of left knee     Large Joint Inj: L knee on 11/03/2023 2:51 PM Indications: pain Details: 22 G needle  Arthrogram: No  Medications: 88 mg Hyaluronan 88 MG/4ML Outcome: tolerated well, no immediate complications Patient was prepped and draped in the usual sterile fashion.     Patient underwent left knee Monovisc injection.

## 2023-11-03 ENCOUNTER — Telehealth: Payer: Self-pay | Admitting: *Deleted

## 2023-11-03 ENCOUNTER — Ambulatory Visit: Payer: PPO | Admitting: Orthopaedic Surgery

## 2023-11-03 DIAGNOSIS — M1712 Unilateral primary osteoarthritis, left knee: Secondary | ICD-10-CM | POA: Diagnosis not present

## 2023-11-03 MED ORDER — HYALURONAN 88 MG/4ML IX SOSY
88.0000 mg | PREFILLED_SYRINGE | INTRA_ARTICULAR | Status: AC | PRN
Start: 2023-11-03 — End: 2023-11-03
  Administered 2023-11-03: 88 mg via INTRA_ARTICULAR

## 2023-11-03 NOTE — Telephone Encounter (Signed)
 1 year Ortho bundle post op call completed.

## 2024-01-02 ENCOUNTER — Other Ambulatory Visit (HOSPITAL_COMMUNITY): Payer: Self-pay

## 2024-01-16 ENCOUNTER — Other Ambulatory Visit: Payer: Self-pay | Admitting: Internal Medicine

## 2024-01-16 DIAGNOSIS — Z1231 Encounter for screening mammogram for malignant neoplasm of breast: Secondary | ICD-10-CM

## 2024-01-31 ENCOUNTER — Encounter: Payer: Self-pay | Admitting: Emergency Medicine

## 2024-01-31 DIAGNOSIS — R053 Chronic cough: Secondary | ICD-10-CM

## 2024-01-31 NOTE — Telephone Encounter (Signed)
**Note De-identified  [place] Obfuscation** Please advise 

## 2024-02-02 ENCOUNTER — Other Ambulatory Visit (HOSPITAL_BASED_OUTPATIENT_CLINIC_OR_DEPARTMENT_OTHER): Payer: Self-pay

## 2024-02-02 MED ORDER — AMOXICILLIN-POT CLAVULANATE 875-125 MG PO TABS
1.0000 | ORAL_TABLET | Freq: Two times a day (BID) | ORAL | 0 refills | Status: DC
  Filled 2024-02-02: qty 10, 5d supply, fill #0

## 2024-02-02 NOTE — Telephone Encounter (Signed)
 Ok to try this to see if she benefits. If the cough persists then she will need to be seen so we can troubleshoot. Script sent

## 2024-02-08 ENCOUNTER — Ambulatory Visit
Admission: RE | Admit: 2024-02-08 | Discharge: 2024-02-08 | Disposition: A | Discharge status: Home / Self Care | Source: Ambulatory Visit | Attending: Internal Medicine | Admitting: Internal Medicine

## 2024-02-08 DIAGNOSIS — Z1231 Encounter for screening mammogram for malignant neoplasm of breast: Secondary | ICD-10-CM

## 2024-03-26 ENCOUNTER — Other Ambulatory Visit (HOSPITAL_BASED_OUTPATIENT_CLINIC_OR_DEPARTMENT_OTHER): Payer: Self-pay

## 2024-03-26 ENCOUNTER — Other Ambulatory Visit: Payer: Self-pay | Admitting: Orthopaedic Surgery

## 2024-03-26 MED ORDER — AMOXICILLIN 500 MG PO CAPS
2000.0000 mg | ORAL_CAPSULE | Freq: Once | ORAL | 2 refills | Status: AC
  Filled 2024-03-26: qty 8, 2d supply, fill #0

## 2024-05-07 ENCOUNTER — Other Ambulatory Visit: Payer: Self-pay | Admitting: Emergency Medicine

## 2024-05-07 ENCOUNTER — Other Ambulatory Visit: Payer: Self-pay

## 2024-05-07 DIAGNOSIS — R053 Chronic cough: Secondary | ICD-10-CM

## 2024-05-08 ENCOUNTER — Encounter (HOSPITAL_COMMUNITY): Payer: Self-pay

## 2024-05-08 ENCOUNTER — Other Ambulatory Visit (HOSPITAL_COMMUNITY): Payer: Self-pay

## 2024-05-16 DIAGNOSIS — J309 Allergic rhinitis, unspecified: Secondary | ICD-10-CM | POA: Diagnosis not present

## 2024-05-16 DIAGNOSIS — Z1211 Encounter for screening for malignant neoplasm of colon: Secondary | ICD-10-CM | POA: Diagnosis not present

## 2024-05-17 ENCOUNTER — Telehealth: Payer: Self-pay | Admitting: Orthopaedic Surgery

## 2024-05-17 NOTE — Telephone Encounter (Signed)
 Patient called. She would like a gel injection in her knee.

## 2024-05-29 DIAGNOSIS — Z1211 Encounter for screening for malignant neoplasm of colon: Secondary | ICD-10-CM | POA: Diagnosis not present

## 2024-05-29 DIAGNOSIS — Z1212 Encounter for screening for malignant neoplasm of rectum: Secondary | ICD-10-CM | POA: Diagnosis not present

## 2024-06-06 ENCOUNTER — Other Ambulatory Visit: Payer: Self-pay

## 2024-06-06 DIAGNOSIS — M1712 Unilateral primary osteoarthritis, left knee: Secondary | ICD-10-CM

## 2024-06-06 NOTE — Telephone Encounter (Signed)
 VOB has been submitted for Monovisc, left knee.

## 2024-06-13 ENCOUNTER — Encounter: Payer: Self-pay | Admitting: Emergency Medicine

## 2024-06-13 ENCOUNTER — Ambulatory Visit: Admitting: Emergency Medicine

## 2024-06-13 ENCOUNTER — Other Ambulatory Visit (HOSPITAL_BASED_OUTPATIENT_CLINIC_OR_DEPARTMENT_OTHER): Payer: Self-pay

## 2024-06-13 VITALS — BP 130/64 | HR 81 | Temp 98.9°F | Wt 171.2 lb

## 2024-06-13 DIAGNOSIS — R053 Chronic cough: Secondary | ICD-10-CM | POA: Diagnosis not present

## 2024-06-13 DIAGNOSIS — J984 Other disorders of lung: Secondary | ICD-10-CM

## 2024-06-13 DIAGNOSIS — J479 Bronchiectasis, uncomplicated: Secondary | ICD-10-CM

## 2024-06-13 DIAGNOSIS — R918 Other nonspecific abnormal finding of lung field: Secondary | ICD-10-CM

## 2024-06-13 DIAGNOSIS — J398 Other specified diseases of upper respiratory tract: Secondary | ICD-10-CM

## 2024-06-13 DIAGNOSIS — R9389 Abnormal findings on diagnostic imaging of other specified body structures: Secondary | ICD-10-CM

## 2024-06-13 MED ORDER — AMOXICILLIN-POT CLAVULANATE 875-125 MG PO TABS
1.0000 | ORAL_TABLET | Freq: Two times a day (BID) | ORAL | 0 refills | Status: AC
  Filled 2024-06-13: qty 14, 7d supply, fill #0

## 2024-06-13 NOTE — Progress Notes (Unsigned)
 Subjective:    Patient ID: [name], female    DOB: November 06, 1950, 74 y.o.   MRN: [id number]  HPI  ROV 04/13/2023 --73 year old woman with a history of left breast cancer whom I have seen for bronchiectasis, tree-in-bud infiltrates and chronic cough.  We obtain sputum cultures 12/23/22 that were all negative for AFB.  She was on loratadine and I tried adding PPI to see if she would get any benefit - she is unsure whether it helped. Sometimes her cough does seem to associate with meals.  Pulmonary function testing was performed as below  Pulm function testing 04/12/2023 reviewed by me shows normal airflows with no bronchodilator response.  Her lung volumes are normal.  Diffusion capacity normal.  Flow volume loop is normal in appearance.  ROV 06/15/2023 --follow-up visit for 73 year old woman with history of left breast cancer and bronchiectasis with tree-in-bud infiltrates on CT.  She has chronic cough.  We performed bronchoscopy on 05/09/2023 that showed diffuse cobblestone changes and edema in the posterior pharynx without any evidence of airway obstruction the tonsils were large.  The vocal cords were normal the subglottic space was normal.  The trachea was collapsible consistent with tracheobronchomalacia with tan secretions bilaterally that were easily suctioned there were no endobronchial lesions.  Cell count showed WBC 1950 that was neutrophil predominant.  Bacterial cultures positive for haemophilus influenza, fungal cultures are negative, AFB smear negative with final culture pending. She has been on protonix  bid which may have helped her some.   ROV 06/13/2024 --[name] is a 73 and follows up today for chronic cough in the setting of bronchiectasis with tree-in-bud infiltrates, tracheobronchomalacia.  She underwent bronchoscopy 05/2023 that showed haemophilus influenza but AFB cultures were negative.  Her cough has been better in the past with high-dose PPI, currently she reports that her cough  continues, can have months of quiet and then returns. Started back in September. Minimally productive. Lots of throat clearing.    Review of Systems As per HPI  Past Medical History:  Diagnosis Date   Arthritis    right hip; hands (07/01/2017)   Cancer of left breast (HCC) 08/2003   S/p left breast mastectomy   Carpal tunnel syndrome, bilateral    PONV (postoperative nausea and vomiting)      Family History  Problem Relation Age of Onset   Alzheimer's disease Mother    Seizures Mother      Social History   Socioeconomic History   Marital status: Married    Spouse name: Nancyann   Number of children: 2   Years of education: Not on file   Highest education level: Not on file  Occupational History   Not on file  Tobacco Use   Smoking status: Never   Smokeless tobacco: Never  Vaping Use   Vaping status: Never Used  Substance and Sexual Activity   Alcohol  use: Yes    Alcohol /week: 6.0 standard drinks of alcohol     Types: 6 Glasses of wine per week    Comment: 5 times a week   Drug use: No   Sexual activity: Yes    Birth control/protection: Post-menopausal  Other Topics Concern   Not on file  Social History Narrative   Not on file   Social Drivers of Health   Financial Resource Strain: Not on file  Food Insecurity: No Food Insecurity (11/01/2022)   Hunger Vital Sign    Worried About Running Out of Food in the Last Year: Never true  Ran Out of Food in the Last Year: Never true  Transportation Needs: No Transportation Needs (11/01/2022)   PRAPARE - Administrator, Civil Service (Medical): No    Lack of Transportation (Non-Medical): No  Physical Activity: Not on file  Stress: Not on file  Social Connections: Not on file  Intimate Partner Violence: Not At Risk (11/01/2022)   Humiliation, Afraid, Rape, and Kick questionnaire    Fear of Current or Ex-Partner: No    Emotionally Abused: No    Physically Abused: No    Sexually Abused: No     No Known  Allergies   Outpatient Medications Prior to Visit  Medication Sig Dispense Refill   amoxicillin -clavulanate (AUGMENTIN ) 875-125 MG tablet Take 1 tablet by mouth 2 (two) times daily. 10 tablet 0   Calcium  Carb-Cholecalciferol  (CALCIUM  600+D) 600-10 MG-MCG TABS Take 1 tablet by mouth in the morning and at bedtime.     Cholecalciferol  (VITAMIN D ) 2000 UNITS tablet Take 2,000 Units by mouth daily.     HYDROcodone  bit-homatropine (HYDROMET) 5-1.5 MG/5ML syrup Take 5 mLs by mouth every 6 (six) hours as needed 200 mL 0   Krill Oil 500 MG CAPS Take 500 mg by mouth daily.     loratadine (CLARITIN) 10 MG tablet Take 10 mg by mouth daily.     Multiple Vitamins-Minerals (MULTIVITAMIN WITH MINERALS) tablet Take 1 tablet by mouth daily.     rosuvastatin  (CRESTOR ) 10 MG tablet Take 1 tablet (10 mg total) by mouth daily. 90 tablet 3   aluminum-magnesium  hydroxide 200-200 MG/5ML suspension Take by mouth every 6 (six) hours as needed for indigestion.     azithromycin  (ZITHROMAX ) 250 MG tablet Take as directed x 5 days Orally 6 tablet 0   Dextromethorphan-Menthol  (DELSYM COUGH RELIEF MT) Use as directed in the mouth or throat. (Patient not taking: Reported on 06/13/2024)     fluticasone  (FLONASE ) 50 MCG/ACT nasal spray Place 2 sprays into both nostrils daily. 16 g 2   fluticasone -salmeterol (ADVAIR DISKUS) 250-50 MCG/ACT AEPB Inhale 1 puff into the lungs in the morning and at bedtime. (Patient not taking: Reported on 06/13/2024)     HYDROcodone  bit-homatropine (HYDROMET) 5-1.5 MG/5ML syrup Take 5 mLs by mouth every 6 (six) hours as needed. 200 mL 0   pantoprazole  (PROTONIX ) 40 MG tablet Take 1 tablet (40 mg total) by mouth daily. 30 tablet 3   No facility-administered medications prior to visit.        Objective:   Physical Exam  Vitals:   06/13/24 0947  BP: 130/64  Pulse: 81  Temp: 98.9 F (37.2 C)  SpO2: 97%  Weight: 171 lb 3.2 oz (77.7 kg)   Gen: Pleasant, well-nourished, in no distress,  normal  affect, frequent throat clearing and occasional cough  ENT: No lesions,  mouth clear,  oropharynx clear, no postnasal drip  Neck: No JVD, no stridor  Lungs: No use of accessory muscles, no crackles or wheezing on normal respiration, no wheeze on forced expiration.  She does cough with a deep breath  Cardiovascular: RRR, heart sounds normal, no murmur or gallops, no peripheral edema  Musculoskeletal: No deformities, no cyanosis or clubbing  Neuro: alert, awake, non focal  Skin: Warm, no lesions or rash      Assessment & Plan:   No problem-specific Assessment & Plan notes found for this encounter.    Time spent 30 minutes  Lamar Chris, MD, PhD 06/13/2024, 10:19 AM [facility] [phone] or if no  answer before 7:00PM call [phone] For any issues after 7:00PM please call eLink [phone]

## 2024-06-13 NOTE — Patient Instructions (Addendum)
 Please take Augmentin  as directed until completely gone. Use cough drops to try to avoid throat clearing Use your Hydromet cough syrup up to every 6 hours if needed for cough suppression We will hold off on starting any reflux or allergy medication right now. Follow-up with Dr. Shelah in December 2025.  At that time depending on how your cough is doing we will decide the timing of a repeat CT scan of the chest, any other medications or testing that might be helpful.

## 2024-06-14 ENCOUNTER — Other Ambulatory Visit (HOSPITAL_BASED_OUTPATIENT_CLINIC_OR_DEPARTMENT_OTHER): Payer: Self-pay

## 2024-06-14 MED ORDER — HYDROCODONE BIT-HOMATROP MBR 5-1.5 MG/5ML PO SOLN
5.0000 mL | Freq: Four times a day (QID) | ORAL | 0 refills | Status: AC | PRN
Start: 2024-06-14 — End: ?
  Filled 2024-06-14: qty 200, 10d supply, fill #0

## 2024-06-14 NOTE — Assessment & Plan Note (Signed)
 Complex cough syndrome, sounds predominantly upper airway and she does have a lot of throat clearing etc.  Superimposed bronchiectasis, tracheobronchomalacia and micronodular disease complicate things and suggest possible lower airways contribution as well.  Interestingly she did get much better after Augmentin  (twice).  On evaluation today she has a lot of throat clearing, upper airway type cough, minimally productive.  We talked about the potential benefits of treating low level chronic rhinitis, GERD.  She believes that she will get the most benefit from antibiotics.  We can try this again but ultimately I think she will need more aggressive treatment of the potential upper airways contributors.  I did also talk to her about minimizing/eliminating her throat clearing, cough suppression to help minimize upper airway irritation.   Please take Augmentin  as directed until completely gone. Use cough drops to try to avoid throat clearing Use your Hydromet cough syrup up to every 6 hours if needed for cough suppression We will hold off on starting any reflux or allergy medication right now. Follow-up with Dr. Shelah in December 2025.  At that time depending on how your cough is doing we will decide the timing of a repeat CT scan of the chest, any other medications or testing that might be helpful.

## 2024-06-14 NOTE — Assessment & Plan Note (Signed)
 With bronchiectasis and micronodular disease suggestive of possible Mycobacterium avium.  Her cultures were negative on 05/2023, only grew out H. influenzae.  Depending on how her cough does we will determine the timing of repeat chest imaging.  Suspect she will need a repeat CT chest in at least 1 year if symptoms remain stable

## 2024-06-15 ENCOUNTER — Other Ambulatory Visit (HOSPITAL_BASED_OUTPATIENT_CLINIC_OR_DEPARTMENT_OTHER): Payer: Self-pay

## 2024-06-21 DIAGNOSIS — D1801 Hemangioma of skin and subcutaneous tissue: Secondary | ICD-10-CM | POA: Diagnosis not present

## 2024-06-21 DIAGNOSIS — L814 Other melanin hyperpigmentation: Secondary | ICD-10-CM | POA: Diagnosis not present

## 2024-06-21 DIAGNOSIS — L821 Other seborrheic keratosis: Secondary | ICD-10-CM | POA: Diagnosis not present

## 2024-06-21 DIAGNOSIS — L538 Other specified erythematous conditions: Secondary | ICD-10-CM | POA: Diagnosis not present

## 2024-06-21 DIAGNOSIS — L820 Inflamed seborrheic keratosis: Secondary | ICD-10-CM | POA: Diagnosis not present

## 2024-07-02 ENCOUNTER — Encounter: Payer: Self-pay | Admitting: Radiology

## 2024-07-06 ENCOUNTER — Other Ambulatory Visit (HOSPITAL_BASED_OUTPATIENT_CLINIC_OR_DEPARTMENT_OTHER): Payer: Self-pay

## 2024-07-09 ENCOUNTER — Other Ambulatory Visit (HOSPITAL_BASED_OUTPATIENT_CLINIC_OR_DEPARTMENT_OTHER): Payer: Self-pay

## 2024-07-09 MED ORDER — AZITHROMYCIN 250 MG PO TABS
ORAL_TABLET | ORAL | 0 refills | Status: AC
  Filled 2024-07-09: qty 6, 5d supply, fill #0

## 2024-07-10 ENCOUNTER — Encounter: Payer: Self-pay | Admitting: Orthopaedic Surgery

## 2024-07-10 ENCOUNTER — Ambulatory Visit: Admitting: Orthopaedic Surgery

## 2024-07-10 DIAGNOSIS — M1712 Unilateral primary osteoarthritis, left knee: Secondary | ICD-10-CM

## 2024-07-10 MED ORDER — HYALURONAN 88 MG/4ML IX SOSY
88.0000 mg | PREFILLED_SYRINGE | INTRA_ARTICULAR | Status: AC | PRN
Start: 2024-07-10 — End: 2024-07-10
  Administered 2024-07-10: 88 mg via INTRA_ARTICULAR

## 2024-07-10 NOTE — Progress Notes (Signed)
   Procedure Note  Patient: [name]             Date of Birth: 06-Dec-1950           MRN: [id number]             Visit Date: 07/10/2024  Procedures: Visit Diagnoses:  1. Primary osteoarthritis of left knee     Large Joint Inj: L knee on 07/10/2024 2:39 PM Indications: pain Details: 22 G needle  Arthrogram: No  Medications: 88 mg Hyaluronan 88 MG/4ML Outcome: tolerated well, no immediate complications Patient was prepped and draped in the usual sterile fashion.

## 2024-07-18 DIAGNOSIS — Z1212 Encounter for screening for malignant neoplasm of rectum: Secondary | ICD-10-CM | POA: Diagnosis not present

## 2024-07-18 DIAGNOSIS — E7849 Other hyperlipidemia: Secondary | ICD-10-CM | POA: Diagnosis not present

## 2024-08-01 ENCOUNTER — Other Ambulatory Visit (HOSPITAL_COMMUNITY): Payer: Self-pay

## 2024-08-03 DIAGNOSIS — R82998 Other abnormal findings in urine: Secondary | ICD-10-CM | POA: Diagnosis not present

## 2024-08-16 ENCOUNTER — Ambulatory Visit: Admitting: Emergency Medicine

## 2024-08-16 VITALS — BP 130/70 | HR 84 | Temp 98.8°F | Ht 67.0 in | Wt 169.0 lb

## 2024-08-16 DIAGNOSIS — R053 Chronic cough: Secondary | ICD-10-CM | POA: Diagnosis not present

## 2024-08-16 DIAGNOSIS — J479 Bronchiectasis, uncomplicated: Secondary | ICD-10-CM | POA: Diagnosis not present

## 2024-08-16 NOTE — Patient Instructions (Signed)
 I glad your cough is doing better. We can hold off on CT scan of the chest for now Please call our office if you have any flaring symptoms, cough, mucus production, shortness of breath.  If so we will treat you for a flare of bronchiectasis and also consider repeating your CT scan of the chest at that time Follow Dr. Shelah in 12 months.

## 2024-08-16 NOTE — Assessment & Plan Note (Signed)
 In absence of any cough, progressive symptoms, daily symptoms, I think we can hold off on repeat CT scan of the chest right now.  If she flares again then would reimage her.  She has not required any pulmonary hygiene routine.

## 2024-08-16 NOTE — Progress Notes (Signed)
 Subjective:    Patient ID: [name], female    DOB: 1950-10-17, 73 y.o.   MRN: [id number]  Cough   ROV 06/15/2023 --follow-up visit for 73 year old woman with history of left breast cancer and bronchiectasis with tree-in-bud infiltrates on CT.  She has chronic cough.  We performed bronchoscopy on 05/09/2023 that showed diffuse cobblestone changes and edema in the posterior pharynx without any evidence of airway obstruction the tonsils were large.  The vocal cords were normal the subglottic space was normal.  The trachea was collapsible consistent with tracheobronchomalacia with tan secretions bilaterally that were easily suctioned there were no endobronchial lesions.  Cell count showed WBC 1950 that was neutrophil predominant.  Bacterial cultures positive for haemophilus influenza, fungal cultures are negative, AFB smear negative with final culture pending. She has been on protonix  bid which may have helped her some.   ROV 06/13/2024 --[name] is a 73 and follows up today for chronic cough in the setting of bronchiectasis with tree-in-bud infiltrates, tracheobronchomalacia.  She underwent bronchoscopy 05/2023 that showed haemophilus influenza but AFB cultures were negative.  Her cough has been a bit better but not resolved in the past with high-dose PPI, currently she reports that her cough continues, can have months of quiet and then returns.  She believes that she has gotten the best relief when she was treated with antibiotics, specifically Augmentin .  Her cough started again in September. Minimally productive. Lots of throat clearing.   ROV 08/16/2024 --follow-up visit 73 year old woman who has a history of bronchiectasis and tree-in-bud nodularity, associated chronic cough and tracheobronchomalacia.  AFB cultures on bronchoscopy 05/2023 were negative, she did have culture positive for H. influenzae.  Her chronic cough has been complex, question all the different contributors but she has benefited  from Augmentin  in the past.  She has Hydromet cough syrup that she can use on an as needed basis.  At her last visit we decided not to start any reflux or allergy medication. She is not having any cough right now - she improved with abx in October and feels better. Denies any significant allergy or GERD.    Review of Systems  Respiratory:  Positive for cough.    As per HPI  Past Medical History:  Diagnosis Date   Arthritis    right hip; hands (07/01/2017)   Cancer of left breast (HCC) 08/2003   S/p left breast mastectomy   Carpal tunnel syndrome, bilateral    PONV (postoperative nausea and vomiting)      Family History  Problem Relation Age of Onset   Alzheimer's disease Mother    Seizures Mother      Social History   Socioeconomic History   Marital status: Married    Spouse name: Nancyann   Number of children: 2   Years of education: Not on file   Highest education level: Not on file  Occupational History   Not on file  Tobacco Use   Smoking status: Never   Smokeless tobacco: Never  Vaping Use   Vaping status: Never Used  Substance and Sexual Activity   Alcohol  use: Yes    Alcohol /week: 6.0 standard drinks of alcohol     Types: 6 Glasses of wine per week    Comment: 5 times a week   Drug use: No   Sexual activity: Yes    Birth control/protection: Post-menopausal  Other Topics Concern   Not on file  Social History Narrative   Not on file   Social Drivers  of Health   Tobacco Use: Low Risk (08/16/2024)   Patient History    Smoking Tobacco Use: Never    Smokeless Tobacco Use: Never    Passive Exposure: Not on file  Financial Resource Strain: Not on file  Food Insecurity: No Food Insecurity (11/01/2022)   Hunger Vital Sign    Worried About Running Out of Food in the Last Year: Never true    Ran Out of Food in the Last Year: Never true  Transportation Needs: No Transportation Needs (11/01/2022)   PRAPARE - Administrator, Civil Service (Medical): No     Lack of Transportation (Non-Medical): No  Physical Activity: Not on file  Stress: Not on file  Social Connections: Not on file  Intimate Partner Violence: Not At Risk (11/01/2022)   Humiliation, Afraid, Rape, and Kick questionnaire    Fear of Current or Ex-Partner: No    Emotionally Abused: No    Physically Abused: No    Sexually Abused: No  Depression (PHQ2-9): Not on file  Alcohol  Screen: Not on file  Housing: Low Risk (11/01/2022)   Housing    Last Housing Risk Score: 0  Utilities: Not At Risk (11/01/2022)   AHC Utilities    Threatened with loss of utilities: No  Health Literacy: Not on file     No Known Allergies   Outpatient Medications Prior to Visit  Medication Sig Dispense Refill   Calcium  Carb-Cholecalciferol  (CALCIUM  600+D) 600-10 MG-MCG TABS Take 1 tablet by mouth in the morning and at bedtime.     Cholecalciferol  (VITAMIN D ) 2000 UNITS tablet Take 2,000 Units by mouth daily.     Krill Oil 500 MG CAPS Take 500 mg by mouth daily.     loratadine (CLARITIN) 10 MG tablet Take 10 mg by mouth daily.     Multiple Vitamins-Minerals (MULTIVITAMIN WITH MINERALS) tablet Take 1 tablet by mouth daily.     rosuvastatin  (CRESTOR ) 10 MG tablet Take 1 tablet (10 mg total) by mouth daily. 90 tablet 3   aluminum-magnesium  hydroxide 200-200 MG/5ML suspension Take by mouth every 6 (six) hours as needed for indigestion.     amoxicillin -clavulanate (AUGMENTIN ) 875-125 MG tablet Take 1 tablet by mouth 2 (two) times daily. (Patient not taking: Reported on 08/16/2024) 14 tablet 0   azithromycin  (ZITHROMAX ) 250 MG tablet Take as directed x 5 days Orally 6 tablet 0   azithromycin  (ZITHROMAX ) 250 MG tablet Take as directed x 5 days Orally (Patient not taking: Reported on 08/16/2024) 6 tablet 0   Dextromethorphan-Menthol  (DELSYM COUGH RELIEF MT) Use as directed in the mouth or throat. (Patient not taking: Reported on 08/16/2024)     fluticasone  (FLONASE ) 50 MCG/ACT nasal spray Place 2 sprays into both  nostrils daily. 16 g 2   fluticasone -salmeterol (ADVAIR DISKUS) 250-50 MCG/ACT AEPB Inhale 1 puff into the lungs in the morning and at bedtime. (Patient not taking: Reported on 08/16/2024)     HYDROcodone  bit-homatropine (HYDROMET) 5-1.5 MG/5ML syrup Take 5 mLs by mouth every 6 (six) hours as needed. 200 mL 0   HYDROcodone  bit-homatropine (HYDROMET) 5-1.5 MG/5ML syrup Take 5 mLs by mouth every 6 (six) hours as needed (Patient not taking: Reported on 08/16/2024) 200 mL 0   pantoprazole  (PROTONIX ) 40 MG tablet Take 1 tablet (40 mg total) by mouth daily. 30 tablet 3   No facility-administered medications prior to visit.        Objective:   Physical Exam  Vitals:   08/16/24 1114  BP: 130/70  Pulse: 84  Temp: 98.8 F (37.1 C)  SpO2: 97%  Weight: 169 lb (76.7 kg)  Height: 5' 7 (1.702 m)   Gen: Pleasant, well-nourished, in no distress,  normal affect, frequent throat clearing and occasional cough  ENT: No lesions,  mouth clear,  oropharynx clear, no postnasal drip  Neck: No JVD, no stridor  Lungs: No use of accessory muscles, no crackles or wheezing on normal respiration, no wheeze on forced expiration.  She does cough with a deep breath  Cardiovascular: RRR, heart sounds normal, no murmur or gallops, no peripheral edema  Musculoskeletal: No deformities, no cyanosis or clubbing  Neuro: alert, awake, non focal  Skin: Warm, no lesions or rash      Assessment & Plan:   Bronchiectasis without complication (HCC) In absence of any cough, progressive symptoms, daily symptoms, I think we can hold off on repeat CT scan of the chest right now.  If she flares again then would reimage her.  She has not required any pulmonary hygiene routine.  Chronic cough Her symptoms have typically sounded upper airway in nature without any evidence of sputum production but she responds to antibiotics quickly suggesting that her episodes may be flares of bronchiectasis.  She had an episode like this  in October and responded quickly to Augmentin .  She does not have any symptoms in between.  We talked about possibly starting a GERD regimen, rhinitis regimen but have held off.  There is a seasonal component to her flare so allergies could be a minor contributor.   I personally spent a total of 27 minutes in the care of the patient today including preparing to see the patient, getting/reviewing separately obtained history, performing a medically appropriate exam/evaluation, counseling and educating, documenting clinical information in the EHR, independently interpreting results, and communicating results.   Lamar Chris, MD, PhD 08/16/2024, 11:40 AM [facility] [phone] or if no answer before 7:00PM call [phone] For any issues after 7:00PM please call eLink [phone]

## 2024-08-16 NOTE — Assessment & Plan Note (Signed)
 Her symptoms have typically sounded upper airway in nature without any evidence of sputum production but she responds to antibiotics quickly suggesting that her episodes may be flares of bronchiectasis.  She had an episode like this in October and responded quickly to Augmentin .  She does not have any symptoms in between.  We talked about possibly starting a GERD regimen, rhinitis regimen but have held off.  There is a seasonal component to her flare so allergies could be a minor contributor.

## 2024-09-13 ENCOUNTER — Other Ambulatory Visit (INDEPENDENT_AMBULATORY_CARE_PROVIDER_SITE_OTHER): Payer: Self-pay

## 2024-09-13 ENCOUNTER — Ambulatory Visit: Admitting: Orthopaedic Surgery

## 2024-09-13 DIAGNOSIS — M25562 Pain in left knee: Secondary | ICD-10-CM

## 2024-09-13 DIAGNOSIS — G8929 Other chronic pain: Secondary | ICD-10-CM | POA: Diagnosis not present

## 2024-09-13 MED ORDER — METHYLPREDNISOLONE ACETATE 40 MG/ML IJ SUSP
40.0000 mg | INTRAMUSCULAR | Status: AC | PRN
  Administered 2024-09-13: 40 mg via INTRA_ARTICULAR

## 2024-09-13 MED ORDER — LIDOCAINE HCL 1 % IJ SOLN
2.0000 mL | INTRAMUSCULAR | Status: AC | PRN
  Administered 2024-09-13: 2 mL

## 2024-09-13 MED ORDER — BUPIVACAINE HCL 0.5 % IJ SOLN
2.0000 mL | INTRAMUSCULAR | Status: AC | PRN
  Administered 2024-09-13: 2 mL via INTRA_ARTICULAR

## 2024-09-13 NOTE — Progress Notes (Signed)
 "  Office Visit Note   Patient: [name]           Date of Birth: August 23, 1951           MRN: [id number] Visit Date: 09/13/2024              Requested by: Onita Rush, MD [id number] [place],  [place] [address] PCP: Onita Rush, MD   Assessment & Plan: Visit Diagnoses:  1. Chronic pain of left knee     Plan: History of Present Illness [name] is a 74 year old female with left knee osteoarthritis who presents with worsening left knee pain and swelling.  Left knee pain and swelling worsened after a December river cruise involving extensive walking on uneven terrain. She had a gel injection before travel. Since returning, the knee has remained markedly swollen and painful without improvement. She has used ice with only minimal relief and denies falls or direct trauma.  About one week ago, she had a brief episode of left leg instability while rising at night, with acute pain and swelling focused in the posterior knee. She used ice and a muscle relaxant for one day, which resolved the acute pain and allowed her to resume usual activity. She feels this episode was atypical and wonders if a posterior knee structure is involved.  Physical Exam MUSCULOSKELETAL: Left knee effusion.  decreased ROM.  no warmth or redness.  Assessment and Plan Left knee osteoarthritis exacerbation with synovitis and symptomatic Baker's cyst Chronic, progressive osteoarthritis with synovitis and Baker's cyst, symptomatic with effusion, pain, and swelling. Radiographs show near bone-on-bone changes.   - She's quite symptomatic from the effusion.  Aspirated 50 cc of synovial fluid and administered steroid injection.  tolerated procedure well.   - follow up if symptoms don't improve or as scheduled for visco injection  Follow-Up Instructions: No follow-ups on file.   Orders:  Orders Placed This Encounter  Procedures   XR KNEE 3 VIEW LEFT   No orders of the defined types were placed in this  encounter.     Procedures: Large Joint Inj: L knee on 09/13/2024 5:04 PM Details: 22 G needle Medications: 2 mL bupivacaine  0.5 %; 2 mL lidocaine  1 %; 40 mg methylPREDNISolone  acetate 40 MG/ML Aspirate: 50 mL Outcome: tolerated well, no immediate complications Patient was prepped and draped in the usual sterile fashion.       Clinical Data: No additional findings.   Subjective: Chief Complaint  Patient presents with   Left Knee - Pain    HPI  Review of Systems  Constitutional: Negative.   HENT: Negative.    Eyes: Negative.   Respiratory: Negative.    Cardiovascular: Negative.   Endocrine: Negative.   Musculoskeletal: Negative.   Neurological: Negative.   Hematological: Negative.   Psychiatric/Behavioral: Negative.    All other systems reviewed and are negative.    Objective: Vital Signs: There were no vitals taken for this visit.  Physical Exam Vitals and nursing note reviewed.  Constitutional:      Appearance: She is well-developed.  HENT:     Head: Atraumatic.     Nose: Nose normal.  Eyes:     Extraocular Movements: Extraocular movements intact.  Cardiovascular:     Pulses: Normal pulses.  Pulmonary:     Effort: Pulmonary effort is normal.  Abdominal:     Palpations: Abdomen is soft.  Musculoskeletal:     Cervical back: Neck supple.  Skin:    General: Skin  is warm.     Capillary Refill: Capillary refill takes less than 2 seconds.  Neurological:     Mental Status: She is alert. Mental status is at baseline.  Psychiatric:        Behavior: Behavior normal.        Thought Content: Thought content normal.        Judgment: Judgment normal.     Ortho Exam  Specialty Comments:  No specialty comments available.  Imaging: XR KNEE 3 VIEW LEFT Result Date: 09/13/2024 X-rays of the left knee show advanced tricompartmental osteoarthritis.  Near bone-on-bone joint space narrowing.    PMFS History: Patient Active Problem List   Diagnosis Date  Noted   Bronchiectasis without complication (HCC) 08/16/2024   Primary osteoarthritis of left knee 04/27/2023   Chronic cough 12/23/2022   Abnormal CT of the chest 12/23/2022   Status post total replacement of left hip 11/01/2022   Primary osteoarthritis of left hip 10/31/2022   Painful orthopaedic hardware 09/28/2018   Closed displaced comminuted fracture of shaft of left tibia 07/01/2017   Bimalleolar ankle fracture, left, closed, initial encounter 07/01/2017   History of open reduction and internal fixation (ORIF) procedure 07/01/2017   History of breast cancer 05/24/2013   Past Medical History:  Diagnosis Date   Arthritis    right hip; hands (07/01/2017)   Cancer of left breast (HCC) 08/2003   S/p left breast mastectomy   Carpal tunnel syndrome, bilateral    PONV (postoperative nausea and vomiting)     Family History  Problem Relation Age of Onset   Alzheimer's disease Mother    Seizures Mother     Past Surgical History:  Procedure Laterality Date   BREAST BIOPSY Left 2005   BREAST BIOPSY Right 06/23/2021   BRONCHIAL WASHINGS  05/09/2023   Procedure: BRONCHIAL WASHINGS;  Surgeon: Shelah Lamar RAMAN, MD;  Location: [facility];  Service: Pulmonary;;   CARPAL TUNNEL RELEASE Bilateral    COLONOSCOPY     FRACTURE SURGERY  07/01/2017   MASTECTOMY Left 10/14/2003   MASTECTOMY WITH AXILLARY LYMPH NODE DISSECTION Left 10/14/2003   OPEN REDUCTION INTERNAL FIXATION (ORIF) TIBIA/FIBULA FRACTURE Left 07/01/2017   OPEN REDUCTION INTERNAL FIXATION (ORIF) LEFT TIBIA/PILON FRACTURE/FIBULA FRACTURE    ORIF ANKLE FRACTURE Left 07/01/2017   Procedure: OPEN REDUCTION INTERNAL FIXATION (ORIF) LEFT TIBIA/PILON FRACTURE/FIBULA FRACTURE;  Surgeon: Jerri Kay HERO, MD;  Location: [facility] OR;  Service: Orthopedics;  Laterality: Left;   RECONSTRUCTION BREAST W/ TRAM FLAP Left 10/14/2003   TOTAL HIP ARTHROPLASTY Left 11/01/2022   Procedure: LEFT TOTAL HIP ARTHROPLASTY ANTERIOR APPROACH;  Surgeon: Jerri Kay HERO, MD;  Location: [facility] OR;  Service: Orthopedics;  Laterality: Left;  3-C   VIDEO BRONCHOSCOPY Bilateral 05/09/2023   Procedure: VIDEO BRONCHOSCOPY WITHOUT FLUORO;  Surgeon: Shelah Lamar RAMAN, MD;  Location: [facility];  Service: Pulmonary;  Laterality: Bilateral;   Social History   Occupational History   Not on file  Tobacco Use   Smoking status: Never   Smokeless tobacco: Never  Vaping Use   Vaping status: Never Used  Substance and Sexual Activity   Alcohol  use: Yes    Alcohol /week: 6.0 standard drinks of alcohol     Types: 6 Glasses of wine per week    Comment: 5 times a week   Drug use: No   Sexual activity: Yes    Birth control/protection: Post-menopausal        "

## 2024-09-16 ENCOUNTER — Encounter: Payer: Self-pay | Admitting: Orthopaedic Surgery

## 2024-09-17 NOTE — Telephone Encounter (Signed)
 Can you work her into my schedule or [facility], whoever has an opening.  Thanks.

## 2024-09-18 ENCOUNTER — Telehealth: Payer: Self-pay

## 2024-09-18 ENCOUNTER — Other Ambulatory Visit (HOSPITAL_BASED_OUTPATIENT_CLINIC_OR_DEPARTMENT_OTHER): Payer: Self-pay

## 2024-09-18 ENCOUNTER — Ambulatory Visit: Admitting: Physician Assistant

## 2024-09-18 ENCOUNTER — Other Ambulatory Visit: Payer: Self-pay

## 2024-09-18 DIAGNOSIS — M1712 Unilateral primary osteoarthritis, left knee: Secondary | ICD-10-CM

## 2024-09-18 MED ORDER — HYDROCODONE-ACETAMINOPHEN 5-325 MG PO TABS
1.0000 | ORAL_TABLET | Freq: Two times a day (BID) | ORAL | 0 refills | Status: DC | PRN
  Filled 2024-09-18: qty 14, 7d supply, fill #0
  Filled 2024-09-24: qty 6, 3d supply, fill #1

## 2024-09-18 NOTE — Telephone Encounter (Signed)
 Patient given surgical clearance form for PCP. Aware that we must receive clearance form back before being able to proceed with scheduling surgery.

## 2024-09-18 NOTE — Progress Notes (Signed)
 "  Office Visit Note   Patient: [name]           Date of Birth: June 19, 1951           MRN: [id number] Visit Date: 09/18/2024              Requested by: Onita Rush, MD [address] [place],  [place] [address] PCP: Onita Rush, MD   Assessment & Plan: Visit Diagnoses:  1. Unilateral primary osteoarthritis, left knee     Plan: Impression is aggravation of underlying left knee osteoarthritis versus degenerative medial meniscus tear versus less likely occult fracture.  The patient has not had much relief from previous cortisone or viscosupplementation injections.  At this point, she is interested in proceeding with knee replacement surgery.  We will order a CT scan to rule out occult fracture at this time as we will be scheduling her for knee replacement surgery.  Will need to schedule this surgery no sooner than 12/12/2024 due to recent cortisone injection.  She understands and agrees.  We have provided her with a clearance form today.  Which she will give to her PCP.  [name] will reach out to her to schedule the surgery once she has received clearance.  Dr. Jerri has filled out the surgery form today.  Impression is severe left knee degenerative joint disease secondary to Osteoarthritis.  Patient has attempted conservative treatment for at least 6 consecutive weeks within the past 12 weeks, including but not limited to physical therapy, home exercise program, NSAIDs, activity modification, and/or corticosteroid injections. Despite these efforts, symptoms have not improved or have worsened. Conservative measures have been deemed unsuccessful at this time. After a detailed discussion covering diagnosis and treatment options--including the risks, benefits, alternatives, and potential complications of surgical and nonsurgical management--the patient elected to proceed with surgery  Anticoagulants: No antithrombotic Postop anticoagulation: Eliquis Diabetic: No  Nickel allergy: No Prior DVT/PE:  No Tobacco use: No Clearances needed for surgery: pcp Anticipated discharge dispo: Home   Follow-Up Instructions: Return if symptoms worsen or fail to improve.   Orders:  Orders Placed This Encounter  Procedures   XR KNEE 3 VIEW LEFT   CT KNEE LEFT WO CONTRAST   Meds ordered this encounter  Medications   HYDROcodone -acetaminophen  (NORCO/VICODIN) 5-325 MG tablet    Sig: Take 1 tablet by mouth 2 (two) times daily as needed.    Dispense:  20 tablet    Refill:  0      Procedures: No procedures performed   Clinical Data: No additional findings.   Subjective: No chief complaint on file.   HPI patient is a 74 year old female who comes in today with worsening left knee pain.  She has a known history of osteoarthritis and has had intermittent gel and cortisone injections.  Her last gel injection was 07/10/2024 which did not provide any relief.  She was seen in our office on 09/13/2024 where she underwent left knee aspiration and cortisone injection.  Unfortunately, she sustained a mechanical fall this past Saturday which has worsened the pain throughout the left knee but primarily to the medial aspect.  Symptoms appear to be worse with walking as she feels as though she has to walk hunched over.  She has been wearing a knee brace and using ice.  She did have a leftover oxycodone  from previous hip replacement surgery which did provide some temporary relief.  Review of Systems as detailed in HPI.  All others reviewed and are negative.  Objective: Vital Signs: There were no vitals taken for this visit.  Physical Exam well-developed well-nourished female in no acute distress.  Alert and oriented x 3.  Ortho Exam left knee exam: Small to moderate effusion.  Range of motion from 0 to 125 degrees.  She does have tenderness along the medial joint line.  No tenderness along the lateral joint line or MCL.  She is able to fully straight leg raise.  She is neurovascularly intact  distally.  Specialty Comments:  No specialty comments available.  Imaging: No new imaging   PMFS History: Patient Active Problem List   Diagnosis Date Noted   Bronchiectasis without complication (HCC) 08/16/2024   Primary osteoarthritis of left knee 04/27/2023   Chronic cough 12/23/2022   Abnormal CT of the chest 12/23/2022   Status post total replacement of left hip 11/01/2022   Primary osteoarthritis of left hip 10/31/2022   Painful orthopaedic hardware 09/28/2018   Closed displaced comminuted fracture of shaft of left tibia 07/01/2017   Bimalleolar ankle fracture, left, closed, initial encounter 07/01/2017   History of open reduction and internal fixation (ORIF) procedure 07/01/2017   History of breast cancer 05/24/2013   Past Medical History:  Diagnosis Date   Arthritis    right hip; hands (07/01/2017)   Cancer of left breast (HCC) 08/2003   S/p left breast mastectomy   Carpal tunnel syndrome, bilateral    PONV (postoperative nausea and vomiting)     Family History  Problem Relation Age of Onset   Alzheimer's disease Mother    Seizures Mother     Past Surgical History:  Procedure Laterality Date   BREAST BIOPSY Left 2005   BREAST BIOPSY Right 06/23/2021   BRONCHIAL WASHINGS  05/09/2023   Procedure: BRONCHIAL WASHINGS;  Surgeon: Shelah Lamar RAMAN, MD;  Location: [facility];  Service: Pulmonary;;   CARPAL TUNNEL RELEASE Bilateral    COLONOSCOPY     FRACTURE SURGERY  07/01/2017   MASTECTOMY Left 10/14/2003   MASTECTOMY WITH AXILLARY LYMPH NODE DISSECTION Left 10/14/2003   OPEN REDUCTION INTERNAL FIXATION (ORIF) TIBIA/FIBULA FRACTURE Left 07/01/2017   OPEN REDUCTION INTERNAL FIXATION (ORIF) LEFT TIBIA/PILON FRACTURE/FIBULA FRACTURE    ORIF ANKLE FRACTURE Left 07/01/2017   Procedure: OPEN REDUCTION INTERNAL FIXATION (ORIF) LEFT TIBIA/PILON FRACTURE/FIBULA FRACTURE;  Surgeon: Jerri Kay HERO, MD;  Location: [facility] OR;  Service: Orthopedics;  Laterality: Left;    RECONSTRUCTION BREAST W/ TRAM FLAP Left 10/14/2003   TOTAL HIP ARTHROPLASTY Left 11/01/2022   Procedure: LEFT TOTAL HIP ARTHROPLASTY ANTERIOR APPROACH;  Surgeon: Jerri Kay HERO, MD;  Location: [facility];  Service: Orthopedics;  Laterality: Left;  3-C   VIDEO BRONCHOSCOPY Bilateral 05/09/2023   Procedure: VIDEO BRONCHOSCOPY WITHOUT FLUORO;  Surgeon: Shelah Lamar RAMAN, MD;  Location: [facility] ENDOSCOPY;  Service: Pulmonary;  Laterality: Bilateral;   Social History   Occupational History   Not on file  Tobacco Use   Smoking status: Never   Smokeless tobacco: Never  Vaping Use   Vaping status: Never Used  Substance and Sexual Activity   Alcohol  use: Yes    Alcohol /week: 6.0 standard drinks of alcohol     Types: 6 Glasses of wine per week    Comment: 5 times a week   Drug use: No   Sexual activity: Yes    Birth control/protection: Post-menopausal        "

## 2024-09-19 ENCOUNTER — Inpatient Hospital Stay: Admission: RE | Admit: 2024-09-19 | Discharge: 2024-09-19 | Attending: Physician Assistant

## 2024-09-19 DIAGNOSIS — M1712 Unilateral primary osteoarthritis, left knee: Secondary | ICD-10-CM

## 2024-09-20 ENCOUNTER — Ambulatory Visit: Payer: Self-pay | Admitting: Physician Assistant

## 2024-09-20 ENCOUNTER — Telehealth: Payer: Self-pay

## 2024-09-20 NOTE — Telephone Encounter (Signed)
 Per Morna Grave regarding Left Knee CT... Tried calling patient to go over ct scan, but I got disconnected.  If she calls back, please let her know that her ct scan was negative for occult fracture.  Just showed arthritis and loose bodies.

## 2024-09-20 NOTE — Progress Notes (Signed)
 Tried calling patient to go over ct scan, but I got disconnected.  If she calls back, please let her know that her ct scan was negative for occult fracture.  Just showed arthritis and loose bodies

## 2024-09-21 ENCOUNTER — Encounter: Payer: Self-pay | Admitting: Orthopaedic Surgery

## 2024-09-21 NOTE — Telephone Encounter (Signed)
 Approve handicap 5 years

## 2024-09-24 ENCOUNTER — Other Ambulatory Visit (HOSPITAL_BASED_OUTPATIENT_CLINIC_OR_DEPARTMENT_OTHER): Payer: Self-pay

## 2024-09-24 ENCOUNTER — Encounter: Payer: Self-pay | Admitting: Orthopaedic Surgery

## 2024-09-24 MED ORDER — OXYCODONE-ACETAMINOPHEN 5-325 MG PO TABS
1.0000 | ORAL_TABLET | Freq: Two times a day (BID) | ORAL | 0 refills | Status: DC | PRN
  Filled 2024-09-24: qty 10, 5d supply, fill #0

## 2024-09-25 ENCOUNTER — Other Ambulatory Visit (HOSPITAL_BASED_OUTPATIENT_CLINIC_OR_DEPARTMENT_OTHER): Payer: Self-pay

## 2024-10-01 NOTE — Telephone Encounter (Signed)
Please order MRI of the left knee to rule out fracture.  Thanks.

## 2024-10-02 ENCOUNTER — Other Ambulatory Visit: Payer: Self-pay

## 2024-10-02 ENCOUNTER — Other Ambulatory Visit (HOSPITAL_BASED_OUTPATIENT_CLINIC_OR_DEPARTMENT_OTHER): Payer: Self-pay

## 2024-10-02 DIAGNOSIS — G8929 Other chronic pain: Secondary | ICD-10-CM

## 2024-10-02 MED ORDER — OXYCODONE-ACETAMINOPHEN 5-325 MG PO TABS
1.0000 | ORAL_TABLET | Freq: Every day | ORAL | 0 refills | Status: AC | PRN
  Filled 2024-10-02 – 2024-10-03 (×2): qty 10, 10d supply, fill #0

## 2024-10-02 NOTE — Addendum Note (Signed)
 Addended by: JERRI LOISE SHARPER on: 10/02/2024 04:59 PM   Modules accepted: Orders

## 2024-10-02 NOTE — Telephone Encounter (Signed)
 Order placed

## 2024-10-03 ENCOUNTER — Other Ambulatory Visit (HOSPITAL_BASED_OUTPATIENT_CLINIC_OR_DEPARTMENT_OTHER): Payer: Self-pay

## 2024-10-04 ENCOUNTER — Other Ambulatory Visit (HOSPITAL_BASED_OUTPATIENT_CLINIC_OR_DEPARTMENT_OTHER): Payer: Self-pay

## 2024-10-09 ENCOUNTER — Other Ambulatory Visit
